# Patient Record
Sex: Male | Born: 1940
Health system: Southern US, Community
[De-identification: ages and names within clinical notes are randomized; demographics above are authoritative.]

## PROBLEM LIST (undated history)

## (undated) DIAGNOSIS — C801 Malignant (primary) neoplasm, unspecified: Secondary | ICD-10-CM

## (undated) DIAGNOSIS — I2510 Atherosclerotic heart disease of native coronary artery without angina pectoris: Secondary | ICD-10-CM

## (undated) DIAGNOSIS — G4730 Sleep apnea, unspecified: Secondary | ICD-10-CM

## (undated) DIAGNOSIS — N3289 Other specified disorders of bladder: Secondary | ICD-10-CM

## (undated) DIAGNOSIS — K4090 Unilateral inguinal hernia, without obstruction or gangrene, not specified as recurrent: Secondary | ICD-10-CM

## (undated) DIAGNOSIS — H409 Unspecified glaucoma: Secondary | ICD-10-CM

## (undated) DIAGNOSIS — J189 Pneumonia, unspecified organism: Secondary | ICD-10-CM

## (undated) DIAGNOSIS — H3530 Unspecified macular degeneration: Secondary | ICD-10-CM

## (undated) HISTORY — PX: TONSILLECTOMY: SUR1361

---

## 1993-10-05 HISTORY — PX: HEMORRHOID SURGERY: SHX153

## 1994-11-05 HISTORY — PX: OTHER SURGICAL HISTORY: SHX169

## 1998-03-22 ENCOUNTER — Ambulatory Visit (HOSPITAL_COMMUNITY): Admission: RE | Admit: 1998-03-22 | Discharge: 1998-03-22 | Payer: Self-pay | Admitting: Cardiology

## 2000-02-03 ENCOUNTER — Emergency Department (HOSPITAL_COMMUNITY): Admission: EM | Admit: 2000-02-03 | Discharge: 2000-02-03 | Payer: Self-pay | Admitting: Emergency Medicine

## 2000-02-03 ENCOUNTER — Encounter: Payer: Self-pay | Admitting: Emergency Medicine

## 2002-03-24 ENCOUNTER — Encounter: Admission: RE | Admit: 2002-03-24 | Discharge: 2002-03-24 | Payer: Self-pay | Admitting: Family Medicine

## 2002-03-24 ENCOUNTER — Encounter: Payer: Self-pay | Admitting: Family Medicine

## 2011-01-09 ENCOUNTER — Other Ambulatory Visit: Payer: Self-pay | Admitting: Internal Medicine

## 2011-01-09 DIAGNOSIS — E291 Testicular hypofunction: Secondary | ICD-10-CM

## 2011-01-14 ENCOUNTER — Other Ambulatory Visit: Payer: Self-pay

## 2011-01-15 ENCOUNTER — Ambulatory Visit
Admission: RE | Admit: 2011-01-15 | Discharge: 2011-01-15 | Disposition: A | Discharge status: Home / Self Care | Payer: Medicare Other | Source: Ambulatory Visit | Attending: Internal Medicine | Admitting: Internal Medicine

## 2011-01-15 DIAGNOSIS — E291 Testicular hypofunction: Secondary | ICD-10-CM

## 2011-10-06 HISTORY — PX: CATARACT EXTRACTION: SUR2

## 2012-01-25 ENCOUNTER — Other Ambulatory Visit: Payer: Self-pay | Admitting: Internal Medicine

## 2012-01-25 DIAGNOSIS — I719 Aortic aneurysm of unspecified site, without rupture: Secondary | ICD-10-CM

## 2012-01-29 ENCOUNTER — Ambulatory Visit
Admission: RE | Admit: 2012-01-29 | Discharge: 2012-01-29 | Disposition: A | Discharge status: Home / Self Care | Payer: BLUE CROSS/BLUE SHIELD | Source: Ambulatory Visit | Attending: Internal Medicine | Admitting: Internal Medicine

## 2012-01-29 ENCOUNTER — Ambulatory Visit
Admission: RE | Admit: 2012-01-29 | Discharge: 2012-01-29 | Disposition: A | Discharge status: Home / Self Care | Payer: Medicare Other | Source: Ambulatory Visit | Attending: Internal Medicine | Admitting: Internal Medicine

## 2012-01-29 DIAGNOSIS — I719 Aortic aneurysm of unspecified site, without rupture: Secondary | ICD-10-CM

## 2012-06-25 IMAGING — RF DG ESOPHAGUS
19 of 24 series · 19 of 24 positions shown · non-contrast
Comparison: {None.}

CLINICAL DATA: [71-year-old male with chronic intermittent
dysphagia to solids and tablets.]

ESOPHOGRAM/BARIUM SWALLOW
TECHNIQUE: Combined double contrast and single contrast
examination performed using effervescent crystals, thick barium
liquid, and thin barium liquid.
Fluoroscopy time:  [2.3] minutes.

[Series 1: run · 1 of 1 slices shown (1 of 19)]
[im 1/1]
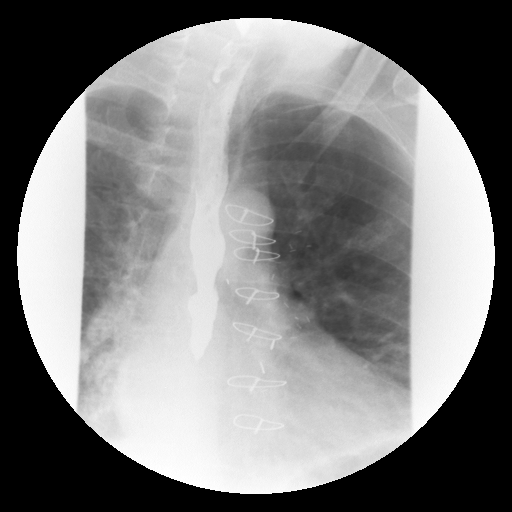

[Series 2: run · 1 of 1 slices shown (2 of 19)]
[im 1/1]
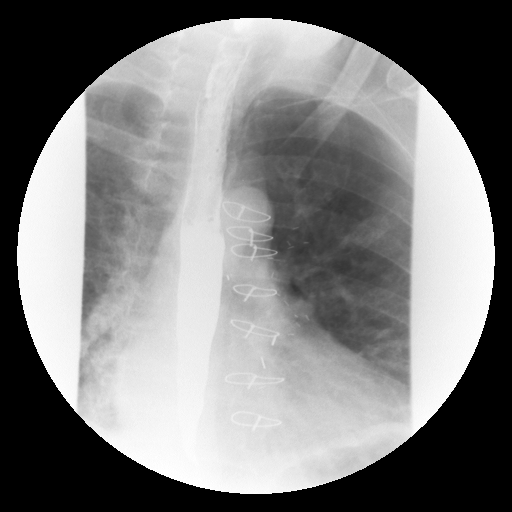

[Series 4: run · 1 of 1 slices shown (3 of 19)]
[im 1/1]
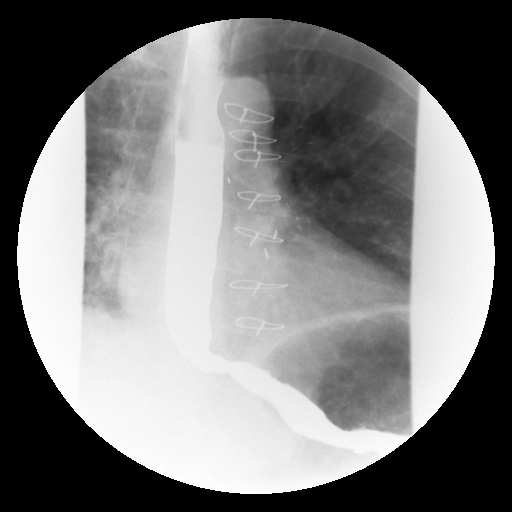

[Series 5: run · 1 of 1 slices shown (4 of 19)]
[im 1/1]
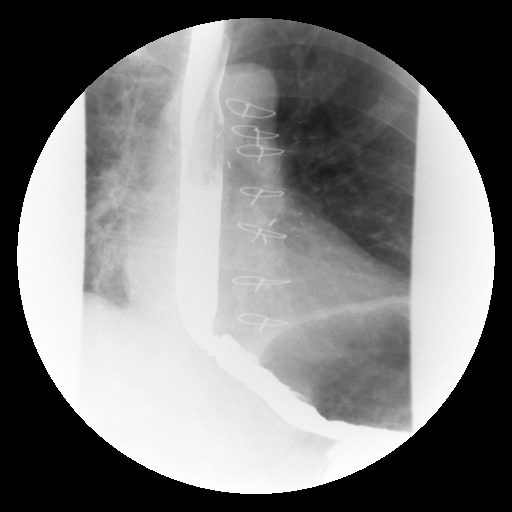

[Series 6: run · 1 of 1 slices shown (5 of 19)]
[im 1/1]
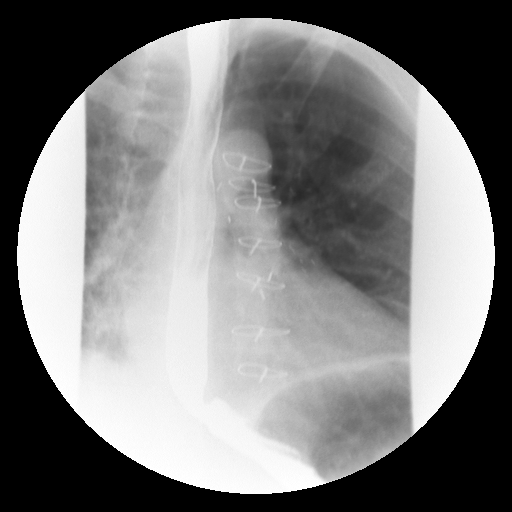

[Series 7: run · 1 of 1 slices shown (6 of 19)]
[im 1/1]
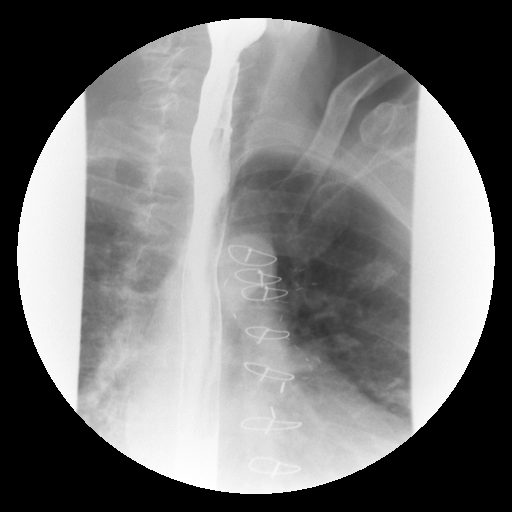

[Series 9: run · 1 of 1 slices shown (7 of 19)]
[im 1/1]
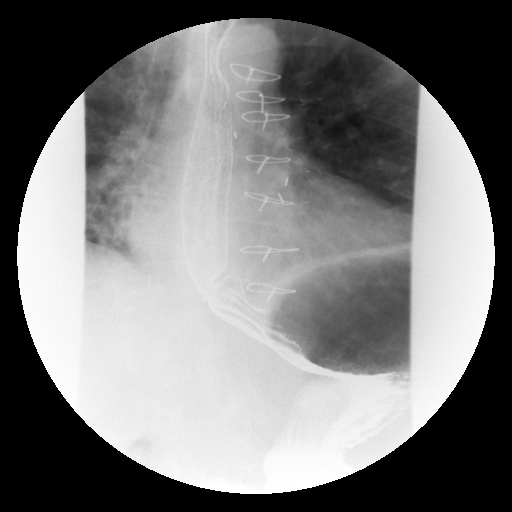

[Series 10: run · 1 of 1 slices shown (8 of 19)]
[im 1/1]
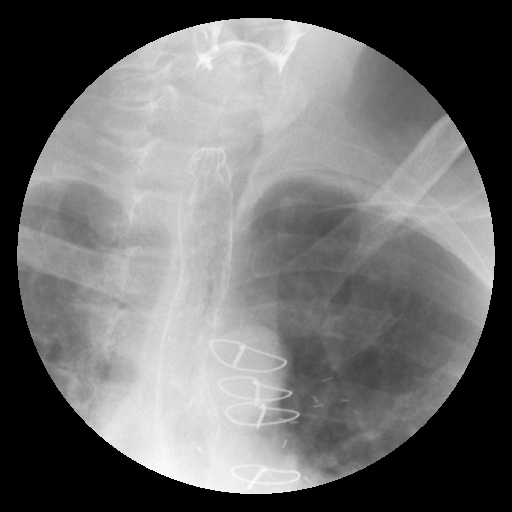

[Series 11: run · 1 of 1 slices shown (9 of 19)]
[im 1/1]
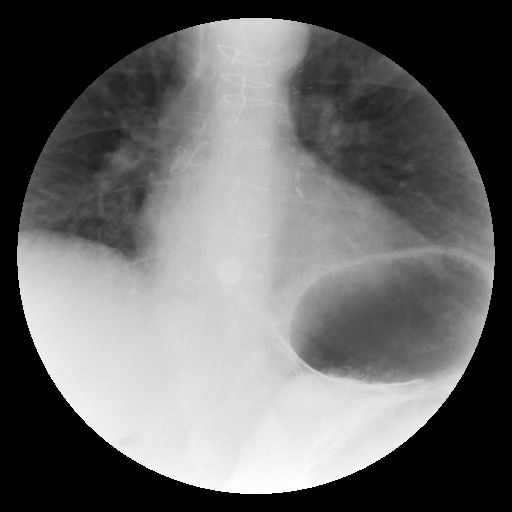

[Series 13: run · 1 of 5 slices shown (10 of 19)]
[im 1/5]
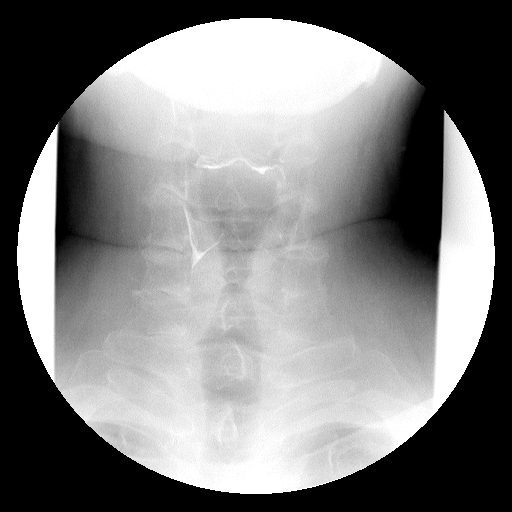

[Series 14: run · 1 of 1 slices shown (11 of 19)]
[im 1/1]
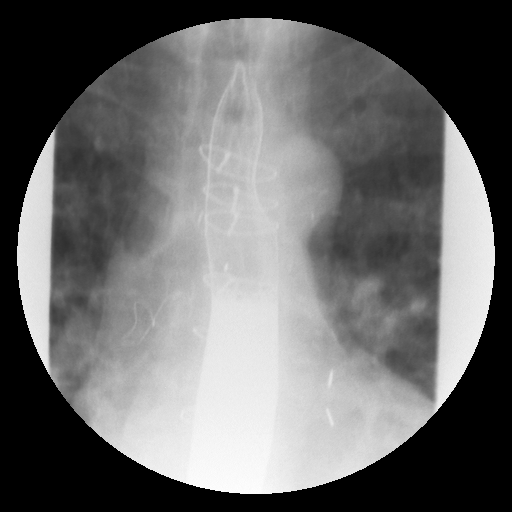

[Series 15: run · 1 of 1 slices shown (12 of 19)]
[im 1/1]
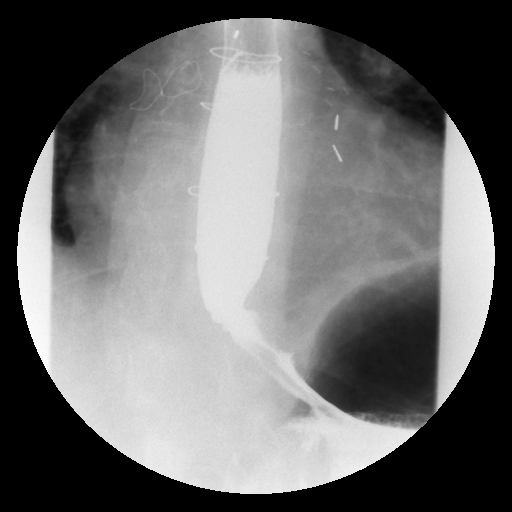

[Series 16: run · 1 of 1 slices shown (13 of 19)]
[im 1/1]
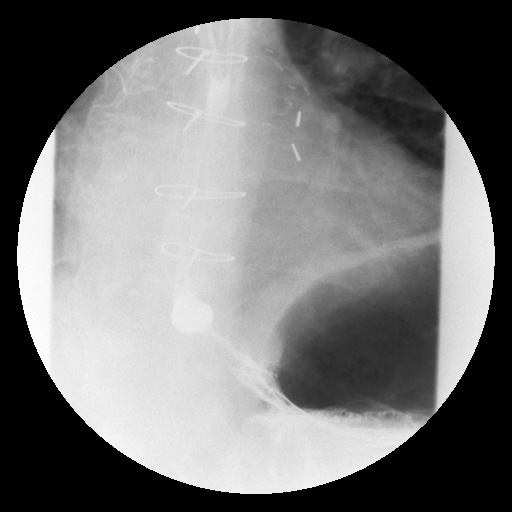

[Series 18: run · 1 of 1 slices shown (14 of 19)]
[im 1/1]
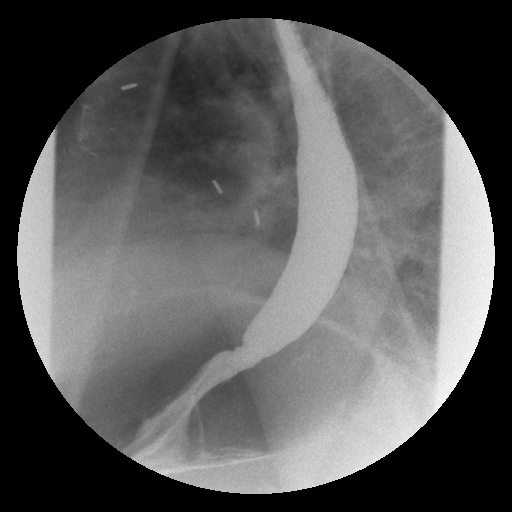

[Series 19: run · 1 of 1 slices shown (15 of 19)]
[im 1/1]
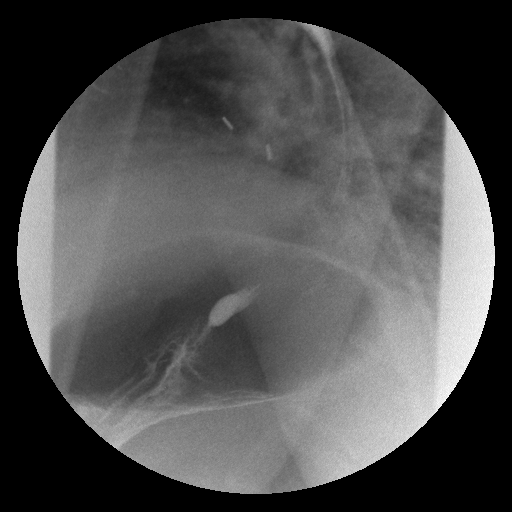

[Series 20: run · 1 of 1 slices shown (16 of 19)]
[im 1/1]
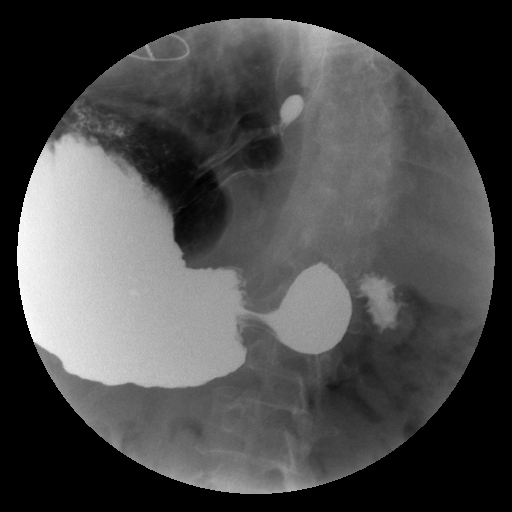

[Series 21: run · 1 of 1 slices shown (17 of 19)]
[im 1/1]
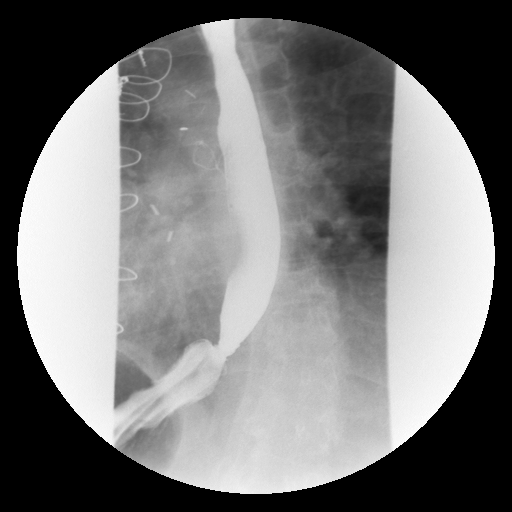

[Series 23: run · 1 of 1 slices shown (18 of 19)]
[im 1/1]
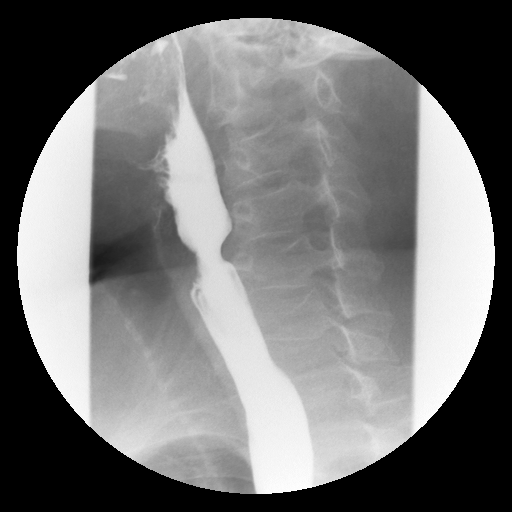

[Series 24: run · 1 of 1 slices shown (19 of 19)]
[im 1/1]
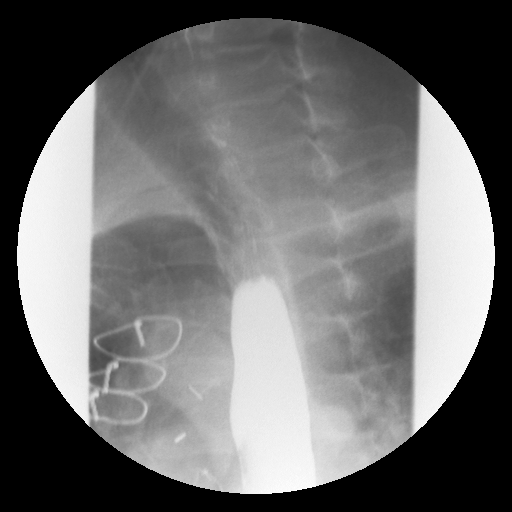

[19 of 24 positions shown; findings below may reference images not displayed]

FINDINGS: [A double contrast study was undertaken and the patient
tolerated this well and without difficulty.

No obstruction to the forward flow of barium contrast throughout
the esophagus and into the stomach.  Persistent mild focal
narrowing at the gastroesophageal junction most resembles a
Schatzki's ring.

12.5 mm barium tablet was administered and passed immediately to
the level of the ring and there was stopped.

Additional swallows of thin barium were performed for rapid
sequence evaluation of the cervical esophagus.  The cervical
esophagus is normal.  These additional swallows did not propel the
tablet into the stomach.

Prone swallows revealed a global decrease in esophageal
peristalsis.  No tertiary contractions occurred.  A small sliding-
type hiatal hernia became apparent (series [DATE]).  Still the
barium tablet remained at the level of the Schatzki's ring.

Unremarkable appearance of the visualized stomach.  No
gastroesophageal reflux was observed or elicited.]
IMPRESSION: [
1.  Findings compatible with a Schatzki's ring which did not permit
a 12.5 mm barium tablet.
2.  Mild presbyesophagus.
3.  Small sliding-type hiatal hernia.  No gastroesophageal reflux.]

## 2013-01-26 ENCOUNTER — Ambulatory Visit (HOSPITAL_COMMUNITY): Payer: Medicare Other | Attending: Internal Medicine | Admitting: Radiology

## 2013-01-26 ENCOUNTER — Encounter (HOSPITAL_COMMUNITY): Payer: Self-pay | Admitting: Internal Medicine

## 2013-01-26 VITALS — Ht 66.0 in | Wt 176.0 lb

## 2013-01-26 DIAGNOSIS — Z8249 Family history of ischemic heart disease and other diseases of the circulatory system: Secondary | ICD-10-CM | POA: Insufficient documentation

## 2013-01-26 DIAGNOSIS — R002 Palpitations: Secondary | ICD-10-CM | POA: Insufficient documentation

## 2013-01-26 DIAGNOSIS — Z951 Presence of aortocoronary bypass graft: Secondary | ICD-10-CM | POA: Insufficient documentation

## 2013-01-26 DIAGNOSIS — E785 Hyperlipidemia, unspecified: Secondary | ICD-10-CM | POA: Insufficient documentation

## 2013-01-26 DIAGNOSIS — I2510 Atherosclerotic heart disease of native coronary artery without angina pectoris: Secondary | ICD-10-CM

## 2013-01-26 DIAGNOSIS — R5383 Other fatigue: Secondary | ICD-10-CM | POA: Insufficient documentation

## 2013-01-26 DIAGNOSIS — R5381 Other malaise: Secondary | ICD-10-CM | POA: Insufficient documentation

## 2013-01-26 MED ORDER — TECHNETIUM TC 99M SESTAMIBI GENERIC - CARDIOLITE
33.0000 | Freq: Once | INTRAVENOUS | Status: AC | PRN
  Administered 2013-01-26: 33 via INTRAVENOUS

## 2013-01-26 MED ORDER — TECHNETIUM TC 99M SESTAMIBI GENERIC - CARDIOLITE
11.0000 | Freq: Once | INTRAVENOUS | Status: AC | PRN
  Administered 2013-01-26: 11 via INTRAVENOUS

## 2013-01-26 NOTE — Progress Notes (Signed)
[facility] SITE 3 NUCLEAR MED [address] [place], [place] [address] [phone]    Cardiology Nuclear Med Study  Aaron Bullock is a 72 y.o. male     MRN : [id number]     DOB: 05-Mar-1941  Procedure Date: 01/26/2013  Nuclear Med Background Indication for Stress Test:  Evaluation for Ischemia and Graft Patency History:  1996 CABG 2010 [facility]PS (outside facility) normal per patient Cardiac Risk Factors: Family History - CAD and Lipids  Symptoms:  Fatigue and Palpitations   Nuclear Pre-Procedure Caffeine/Decaff Intake:  None NPO After: 7:30am   Lungs:  clear O2 Sat: 94% on room air. IV 0.9% NS with Angio Cath:  22g  IV Site: L Antecubital  IV Started by:  Bonnita Levan, RN  Chest Size (in):  44 Cup Size: n/a  Height: 5\' 6"  (1.676 m)  Weight:  176 lb (79.833 kg)  BMI:  Body mass index is 28.42 kg/(m^2). Tech Comments:  N/A    Nuclear Med Study 1 or 2 day study: 1 day  Stress Test Type:  Stress  Reading MD: Marca Ancona, MD  Order Authorizing Provider:  Mia Creek, MD  Resting Radionuclide: Technetium 37m Sestamibi  Resting Radionuclide Dose: 11.0 mCi   Stress Radionuclide:  Technetium 33m Sestamibi  Stress Radionuclide Dose: 33.0 mCi           Stress Protocol Rest HR: 68 Stress HR: 137  Rest BP: 143/64 Stress BP: 201/80  Exercise Time (min): 5:46 METS: 7.0   Predicted Max HR: 146 bpm % Max HR: 93.84 bpm Rate Pressure Product: 01027   Dose of Adenosine (mg):  n/a Dose of Lexiscan: n/a mg  Dose of Atropine (mg): n/a Dose of Dobutamine: n/a mcg/kg/min (at max HR)  Stress Test Technologist: Bonnita Levan, RN  Nuclear Technologist:  Domenic Polite, CNMT     Rest Procedure:  Myocardial perfusion imaging was performed at rest 45 minutes following the intravenous administration of Technetium 72m Sestamibi. Rest ECG: NSR - Normal EKG  Stress Procedure:  The patient exercised on the treadmill utilizing the Bruce Protocol for 5:46 minutes. The patient stopped  due to dyspnea and fatigue and denied any chest pain.  Technetium 60m Sestamibi was injected at peak exercise and myocardial perfusion imaging was performed after a brief delay. Stress ECG: No significant change from baseline ECG  QPS Raw Data Images:  Normal; no motion artifact; normal heart/lung ratio. Stress Images:  Normal homogeneous uptake in all areas of the myocardium. Rest Images:  Normal homogeneous uptake in all areas of the myocardium. Subtraction (SDS):  There is no evidence of scar or ischemia. Transient Ischemic Dilatation (Normal <1.22):  0.96 Lung/Heart Ratio (Normal <0.45):  0.37  Quantitative Gated Spect Images QGS EDV:  82 ml QGS ESV:  29 ml  Impression Exercise Capacity:  Fair exercise capacity. BP Response:  Hypertensive blood pressure response. Clinical Symptoms:  Dyspnea and fatigue.  ECG Impression:  No significant ST segment change suggestive of ischemia. Comparison with Prior Nuclear Study: No images to compare  Overall Impression:  Normal stress nuclear study.  LV Ejection Fraction: 84%.  LV Wall Motion:  NL LV Function; NL Wall Motion  Marca Ancona 01/26/2013

## 2013-01-27 ENCOUNTER — Encounter (HOSPITAL_COMMUNITY): Payer: Self-pay | Admitting: Internal Medicine

## 2013-01-27 NOTE — Progress Notes (Signed)
Nuclear report faxed to Dr. Ananias Pilgrim. Aaron Bullock, RT-N

## 2015-01-11 ENCOUNTER — Other Ambulatory Visit: Payer: Self-pay | Admitting: Internal Medicine

## 2015-01-11 DIAGNOSIS — M810 Age-related osteoporosis without current pathological fracture: Secondary | ICD-10-CM

## 2015-01-17 ENCOUNTER — Ambulatory Visit
Admission: RE | Admit: 2015-01-17 | Discharge: 2015-01-17 | Disposition: A | Discharge status: Home / Self Care | Payer: Medicare Other | Source: Ambulatory Visit | Attending: Internal Medicine | Admitting: Internal Medicine

## 2015-01-17 DIAGNOSIS — M810 Age-related osteoporosis without current pathological fracture: Secondary | ICD-10-CM

## 2016-05-16 ENCOUNTER — Other Ambulatory Visit: Payer: Self-pay | Admitting: Ophthalmology

## 2016-05-16 DIAGNOSIS — H47011 Ischemic optic neuropathy, right eye: Secondary | ICD-10-CM

## 2016-05-28 ENCOUNTER — Ambulatory Visit
Admission: RE | Admit: 2016-05-28 | Discharge: 2016-05-28 | Disposition: A | Discharge status: Home / Self Care | Payer: Medicare Other | Source: Ambulatory Visit | Attending: Ophthalmology | Admitting: Ophthalmology

## 2016-05-28 DIAGNOSIS — H47011 Ischemic optic neuropathy, right eye: Secondary | ICD-10-CM

## 2016-05-28 MED ORDER — GADOBENATE DIMEGLUMINE 529 MG/ML IV SOLN
15.0000 mL | Freq: Once | INTRAVENOUS | Status: AC | PRN
  Administered 2016-05-28: 15 mL via INTRAVENOUS

## 2016-11-12 DIAGNOSIS — Z85828 Personal history of other malignant neoplasm of skin: Secondary | ICD-10-CM | POA: Diagnosis not present

## 2016-11-12 DIAGNOSIS — L570 Actinic keratosis: Secondary | ICD-10-CM | POA: Diagnosis not present

## 2016-11-12 DIAGNOSIS — D1800 Hemangioma unspecified site: Secondary | ICD-10-CM | POA: Diagnosis not present

## 2016-11-12 DIAGNOSIS — D225 Melanocytic nevi of trunk: Secondary | ICD-10-CM | POA: Diagnosis not present

## 2016-11-12 DIAGNOSIS — L821 Other seborrheic keratosis: Secondary | ICD-10-CM | POA: Diagnosis not present

## 2016-11-12 DIAGNOSIS — D1801 Hemangioma of skin and subcutaneous tissue: Secondary | ICD-10-CM | POA: Diagnosis not present

## 2016-11-12 DIAGNOSIS — L814 Other melanin hyperpigmentation: Secondary | ICD-10-CM | POA: Diagnosis not present

## 2016-11-12 DIAGNOSIS — Z23 Encounter for immunization: Secondary | ICD-10-CM | POA: Diagnosis not present

## 2016-12-11 DIAGNOSIS — Z961 Presence of intraocular lens: Secondary | ICD-10-CM | POA: Diagnosis not present

## 2016-12-11 DIAGNOSIS — H02839 Dermatochalasis of unspecified eye, unspecified eyelid: Secondary | ICD-10-CM | POA: Diagnosis not present

## 2016-12-11 DIAGNOSIS — H401131 Primary open-angle glaucoma, bilateral, mild stage: Secondary | ICD-10-CM | POA: Diagnosis not present

## 2016-12-11 DIAGNOSIS — H18413 Arcus senilis, bilateral: Secondary | ICD-10-CM | POA: Diagnosis not present

## 2016-12-11 DIAGNOSIS — H47011 Ischemic optic neuropathy, right eye: Secondary | ICD-10-CM | POA: Diagnosis not present

## 2017-01-12 DIAGNOSIS — H401113 Primary open-angle glaucoma, right eye, severe stage: Secondary | ICD-10-CM | POA: Diagnosis not present

## 2017-01-12 DIAGNOSIS — H35363 Drusen (degenerative) of macula, bilateral: Secondary | ICD-10-CM | POA: Diagnosis not present

## 2017-01-12 DIAGNOSIS — H3589 Other specified retinal disorders: Secondary | ICD-10-CM | POA: Diagnosis not present

## 2017-01-12 DIAGNOSIS — H47239 Glaucomatous optic atrophy, unspecified eye: Secondary | ICD-10-CM | POA: Diagnosis not present

## 2017-01-12 DIAGNOSIS — H3530 Unspecified macular degeneration: Secondary | ICD-10-CM | POA: Diagnosis not present

## 2017-01-12 DIAGNOSIS — H5340 Unspecified visual field defects: Secondary | ICD-10-CM | POA: Diagnosis not present

## 2017-02-05 DIAGNOSIS — H401113 Primary open-angle glaucoma, right eye, severe stage: Secondary | ICD-10-CM | POA: Diagnosis not present

## 2017-02-05 DIAGNOSIS — H401121 Primary open-angle glaucoma, left eye, mild stage: Secondary | ICD-10-CM | POA: Diagnosis not present

## 2017-02-05 DIAGNOSIS — Z961 Presence of intraocular lens: Secondary | ICD-10-CM | POA: Diagnosis not present

## 2017-02-05 DIAGNOSIS — H353132 Nonexudative age-related macular degeneration, bilateral, intermediate dry stage: Secondary | ICD-10-CM | POA: Diagnosis not present

## 2017-03-09 DIAGNOSIS — L820 Inflamed seborrheic keratosis: Secondary | ICD-10-CM | POA: Diagnosis not present

## 2017-03-09 DIAGNOSIS — L570 Actinic keratosis: Secondary | ICD-10-CM | POA: Diagnosis not present

## 2017-03-18 DIAGNOSIS — H4051X3 Glaucoma secondary to other eye disorders, right eye, severe stage: Secondary | ICD-10-CM | POA: Diagnosis not present

## 2017-03-18 DIAGNOSIS — H353112 Nonexudative age-related macular degeneration, right eye, intermediate dry stage: Secondary | ICD-10-CM | POA: Diagnosis not present

## 2017-03-18 DIAGNOSIS — H353211 Exudative age-related macular degeneration, right eye, with active choroidal neovascularization: Secondary | ICD-10-CM | POA: Diagnosis not present

## 2017-03-18 DIAGNOSIS — H35721 Serous detachment of retinal pigment epithelium, right eye: Secondary | ICD-10-CM | POA: Diagnosis not present

## 2017-03-18 DIAGNOSIS — H26491 Other secondary cataract, right eye: Secondary | ICD-10-CM | POA: Diagnosis not present

## 2017-04-23 DIAGNOSIS — H353211 Exudative age-related macular degeneration, right eye, with active choroidal neovascularization: Secondary | ICD-10-CM | POA: Diagnosis not present

## 2017-05-27 DIAGNOSIS — H353112 Nonexudative age-related macular degeneration, right eye, intermediate dry stage: Secondary | ICD-10-CM | POA: Diagnosis not present

## 2017-05-27 DIAGNOSIS — H353211 Exudative age-related macular degeneration, right eye, with active choroidal neovascularization: Secondary | ICD-10-CM | POA: Diagnosis not present

## 2017-05-27 DIAGNOSIS — H43821 Vitreomacular adhesion, right eye: Secondary | ICD-10-CM | POA: Diagnosis not present

## 2017-05-27 DIAGNOSIS — H35721 Serous detachment of retinal pigment epithelium, right eye: Secondary | ICD-10-CM | POA: Diagnosis not present

## 2017-06-29 DIAGNOSIS — H353112 Nonexudative age-related macular degeneration, right eye, intermediate dry stage: Secondary | ICD-10-CM | POA: Diagnosis not present

## 2017-06-29 DIAGNOSIS — H35721 Serous detachment of retinal pigment epithelium, right eye: Secondary | ICD-10-CM | POA: Diagnosis not present

## 2017-06-29 DIAGNOSIS — H353211 Exudative age-related macular degeneration, right eye, with active choroidal neovascularization: Secondary | ICD-10-CM | POA: Diagnosis not present

## 2017-06-29 DIAGNOSIS — H4051X3 Glaucoma secondary to other eye disorders, right eye, severe stage: Secondary | ICD-10-CM | POA: Diagnosis not present

## 2017-06-29 DIAGNOSIS — H43821 Vitreomacular adhesion, right eye: Secondary | ICD-10-CM | POA: Diagnosis not present

## 2017-08-05 DIAGNOSIS — H43821 Vitreomacular adhesion, right eye: Secondary | ICD-10-CM | POA: Diagnosis not present

## 2017-08-05 DIAGNOSIS — H35721 Serous detachment of retinal pigment epithelium, right eye: Secondary | ICD-10-CM | POA: Diagnosis not present

## 2017-08-05 DIAGNOSIS — H353112 Nonexudative age-related macular degeneration, right eye, intermediate dry stage: Secondary | ICD-10-CM | POA: Diagnosis not present

## 2017-08-05 DIAGNOSIS — H4051X3 Glaucoma secondary to other eye disorders, right eye, severe stage: Secondary | ICD-10-CM | POA: Diagnosis not present

## 2017-08-05 DIAGNOSIS — H353211 Exudative age-related macular degeneration, right eye, with active choroidal neovascularization: Secondary | ICD-10-CM | POA: Diagnosis not present

## 2017-09-10 DIAGNOSIS — H35721 Serous detachment of retinal pigment epithelium, right eye: Secondary | ICD-10-CM | POA: Diagnosis not present

## 2017-09-10 DIAGNOSIS — H4051X3 Glaucoma secondary to other eye disorders, right eye, severe stage: Secondary | ICD-10-CM | POA: Diagnosis not present

## 2017-09-10 DIAGNOSIS — H353211 Exudative age-related macular degeneration, right eye, with active choroidal neovascularization: Secondary | ICD-10-CM | POA: Diagnosis not present

## 2017-09-10 DIAGNOSIS — H43821 Vitreomacular adhesion, right eye: Secondary | ICD-10-CM | POA: Diagnosis not present

## 2017-10-14 DIAGNOSIS — H4051X3 Glaucoma secondary to other eye disorders, right eye, severe stage: Secondary | ICD-10-CM | POA: Diagnosis not present

## 2017-10-14 DIAGNOSIS — H353211 Exudative age-related macular degeneration, right eye, with active choroidal neovascularization: Secondary | ICD-10-CM | POA: Diagnosis not present

## 2017-10-14 DIAGNOSIS — H35721 Serous detachment of retinal pigment epithelium, right eye: Secondary | ICD-10-CM | POA: Diagnosis not present

## 2017-10-14 DIAGNOSIS — H26491 Other secondary cataract, right eye: Secondary | ICD-10-CM | POA: Diagnosis not present

## 2017-10-14 DIAGNOSIS — H43821 Vitreomacular adhesion, right eye: Secondary | ICD-10-CM | POA: Diagnosis not present

## 2017-11-18 DIAGNOSIS — H43821 Vitreomacular adhesion, right eye: Secondary | ICD-10-CM | POA: Diagnosis not present

## 2017-11-18 DIAGNOSIS — H353211 Exudative age-related macular degeneration, right eye, with active choroidal neovascularization: Secondary | ICD-10-CM | POA: Diagnosis not present

## 2017-11-18 DIAGNOSIS — H4051X3 Glaucoma secondary to other eye disorders, right eye, severe stage: Secondary | ICD-10-CM | POA: Diagnosis not present

## 2017-11-23 DIAGNOSIS — L821 Other seborrheic keratosis: Secondary | ICD-10-CM | POA: Diagnosis not present

## 2017-11-23 DIAGNOSIS — D225 Melanocytic nevi of trunk: Secondary | ICD-10-CM | POA: Diagnosis not present

## 2017-11-23 DIAGNOSIS — L814 Other melanin hyperpigmentation: Secondary | ICD-10-CM | POA: Diagnosis not present

## 2017-11-23 DIAGNOSIS — Z23 Encounter for immunization: Secondary | ICD-10-CM | POA: Diagnosis not present

## 2017-11-23 DIAGNOSIS — D1801 Hemangioma of skin and subcutaneous tissue: Secondary | ICD-10-CM | POA: Diagnosis not present

## 2017-11-23 DIAGNOSIS — L570 Actinic keratosis: Secondary | ICD-10-CM | POA: Diagnosis not present

## 2017-11-23 DIAGNOSIS — Z85828 Personal history of other malignant neoplasm of skin: Secondary | ICD-10-CM | POA: Diagnosis not present

## 2017-11-23 DIAGNOSIS — D1800 Hemangioma unspecified site: Secondary | ICD-10-CM | POA: Diagnosis not present

## 2017-12-23 DIAGNOSIS — H35721 Serous detachment of retinal pigment epithelium, right eye: Secondary | ICD-10-CM | POA: Diagnosis not present

## 2017-12-23 DIAGNOSIS — H43821 Vitreomacular adhesion, right eye: Secondary | ICD-10-CM | POA: Diagnosis not present

## 2017-12-23 DIAGNOSIS — H353211 Exudative age-related macular degeneration, right eye, with active choroidal neovascularization: Secondary | ICD-10-CM | POA: Diagnosis not present

## 2017-12-23 DIAGNOSIS — H4051X3 Glaucoma secondary to other eye disorders, right eye, severe stage: Secondary | ICD-10-CM | POA: Diagnosis not present

## 2017-12-23 DIAGNOSIS — H353112 Nonexudative age-related macular degeneration, right eye, intermediate dry stage: Secondary | ICD-10-CM | POA: Diagnosis not present

## 2018-02-02 DIAGNOSIS — H43821 Vitreomacular adhesion, right eye: Secondary | ICD-10-CM | POA: Diagnosis not present

## 2018-02-02 DIAGNOSIS — H353211 Exudative age-related macular degeneration, right eye, with active choroidal neovascularization: Secondary | ICD-10-CM | POA: Diagnosis not present

## 2018-02-02 DIAGNOSIS — H43811 Vitreous degeneration, right eye: Secondary | ICD-10-CM | POA: Diagnosis not present

## 2018-02-02 DIAGNOSIS — H4051X3 Glaucoma secondary to other eye disorders, right eye, severe stage: Secondary | ICD-10-CM | POA: Diagnosis not present

## 2018-03-09 DIAGNOSIS — H4051X3 Glaucoma secondary to other eye disorders, right eye, severe stage: Secondary | ICD-10-CM | POA: Diagnosis not present

## 2018-03-09 DIAGNOSIS — H43811 Vitreous degeneration, right eye: Secondary | ICD-10-CM | POA: Diagnosis not present

## 2018-03-09 DIAGNOSIS — H43821 Vitreomacular adhesion, right eye: Secondary | ICD-10-CM | POA: Diagnosis not present

## 2018-03-09 DIAGNOSIS — H353211 Exudative age-related macular degeneration, right eye, with active choroidal neovascularization: Secondary | ICD-10-CM | POA: Diagnosis not present

## 2018-04-12 DIAGNOSIS — H43821 Vitreomacular adhesion, right eye: Secondary | ICD-10-CM | POA: Diagnosis not present

## 2018-04-12 DIAGNOSIS — H35721 Serous detachment of retinal pigment epithelium, right eye: Secondary | ICD-10-CM | POA: Diagnosis not present

## 2018-04-12 DIAGNOSIS — H4051X3 Glaucoma secondary to other eye disorders, right eye, severe stage: Secondary | ICD-10-CM | POA: Diagnosis not present

## 2018-04-12 DIAGNOSIS — H4312 Vitreous hemorrhage, left eye: Secondary | ICD-10-CM | POA: Diagnosis not present

## 2018-04-12 DIAGNOSIS — H353211 Exudative age-related macular degeneration, right eye, with active choroidal neovascularization: Secondary | ICD-10-CM | POA: Diagnosis not present

## 2018-04-15 DIAGNOSIS — H353210 Exudative age-related macular degeneration, right eye, stage unspecified: Secondary | ICD-10-CM | POA: Diagnosis not present

## 2018-04-15 DIAGNOSIS — Z961 Presence of intraocular lens: Secondary | ICD-10-CM | POA: Diagnosis not present

## 2018-04-15 DIAGNOSIS — H353122 Nonexudative age-related macular degeneration, left eye, intermediate dry stage: Secondary | ICD-10-CM | POA: Diagnosis not present

## 2018-04-15 DIAGNOSIS — H401133 Primary open-angle glaucoma, bilateral, severe stage: Secondary | ICD-10-CM | POA: Diagnosis not present

## 2018-05-03 DIAGNOSIS — L570 Actinic keratosis: Secondary | ICD-10-CM | POA: Diagnosis not present

## 2018-05-03 DIAGNOSIS — L820 Inflamed seborrheic keratosis: Secondary | ICD-10-CM | POA: Diagnosis not present

## 2018-05-18 DIAGNOSIS — H4051X3 Glaucoma secondary to other eye disorders, right eye, severe stage: Secondary | ICD-10-CM | POA: Diagnosis not present

## 2018-05-18 DIAGNOSIS — H4312 Vitreous hemorrhage, left eye: Secondary | ICD-10-CM | POA: Diagnosis not present

## 2018-05-18 DIAGNOSIS — H35721 Serous detachment of retinal pigment epithelium, right eye: Secondary | ICD-10-CM | POA: Diagnosis not present

## 2018-05-18 DIAGNOSIS — H353211 Exudative age-related macular degeneration, right eye, with active choroidal neovascularization: Secondary | ICD-10-CM | POA: Diagnosis not present

## 2018-05-18 DIAGNOSIS — H43821 Vitreomacular adhesion, right eye: Secondary | ICD-10-CM | POA: Diagnosis not present

## 2018-07-13 DIAGNOSIS — H43821 Vitreomacular adhesion, right eye: Secondary | ICD-10-CM | POA: Diagnosis not present

## 2018-07-13 DIAGNOSIS — H4051X3 Glaucoma secondary to other eye disorders, right eye, severe stage: Secondary | ICD-10-CM | POA: Diagnosis not present

## 2018-07-13 DIAGNOSIS — H353211 Exudative age-related macular degeneration, right eye, with active choroidal neovascularization: Secondary | ICD-10-CM | POA: Diagnosis not present

## 2018-07-13 DIAGNOSIS — H43811 Vitreous degeneration, right eye: Secondary | ICD-10-CM | POA: Diagnosis not present

## 2018-08-08 DIAGNOSIS — H47011 Ischemic optic neuropathy, right eye: Secondary | ICD-10-CM | POA: Diagnosis not present

## 2018-08-08 DIAGNOSIS — H401133 Primary open-angle glaucoma, bilateral, severe stage: Secondary | ICD-10-CM | POA: Diagnosis not present

## 2018-08-08 DIAGNOSIS — H04122 Dry eye syndrome of left lacrimal gland: Secondary | ICD-10-CM | POA: Diagnosis not present

## 2018-09-07 DIAGNOSIS — H43821 Vitreomacular adhesion, right eye: Secondary | ICD-10-CM | POA: Diagnosis not present

## 2018-09-07 DIAGNOSIS — H353123 Nonexudative age-related macular degeneration, left eye, advanced atrophic without subfoveal involvement: Secondary | ICD-10-CM | POA: Diagnosis not present

## 2018-09-07 DIAGNOSIS — H4720 Unspecified optic atrophy: Secondary | ICD-10-CM | POA: Diagnosis not present

## 2018-09-07 DIAGNOSIS — H353211 Exudative age-related macular degeneration, right eye, with active choroidal neovascularization: Secondary | ICD-10-CM | POA: Diagnosis not present

## 2018-10-14 ENCOUNTER — Encounter: Payer: Self-pay | Admitting: Family Medicine

## 2018-10-14 ENCOUNTER — Ambulatory Visit (INDEPENDENT_AMBULATORY_CARE_PROVIDER_SITE_OTHER): Payer: Medicare Other | Admitting: Family Medicine

## 2018-10-14 VITALS — BP 122/76 | HR 71 | Temp 98.1°F | Wt 160.8 lb

## 2018-10-14 DIAGNOSIS — Z136 Encounter for screening for cardiovascular disorders: Secondary | ICD-10-CM | POA: Diagnosis not present

## 2018-10-14 DIAGNOSIS — Z2821 Immunization not carried out because of patient refusal: Secondary | ICD-10-CM | POA: Diagnosis not present

## 2018-10-14 DIAGNOSIS — Z0130 Encounter for examination of blood pressure without abnormal findings: Secondary | ICD-10-CM | POA: Diagnosis not present

## 2018-10-14 DIAGNOSIS — R7309 Other abnormal glucose: Secondary | ICD-10-CM | POA: Diagnosis not present

## 2018-10-14 DIAGNOSIS — Z951 Presence of aortocoronary bypass graft: Secondary | ICD-10-CM | POA: Diagnosis not present

## 2018-10-14 DIAGNOSIS — I2510 Atherosclerotic heart disease of native coronary artery without angina pectoris: Secondary | ICD-10-CM | POA: Diagnosis not present

## 2018-10-14 NOTE — Progress Notes (Addendum)
   Subjective:    Patient ID: Aaron Bullock, male    DOB: [birth year], 78 y.o.   MRN: [id number]  HPI He is here for consultation concerning his blood pressure.  He apparently had some difficulty with headache and one episode of epistaxis.  He did get a blood pressure cuff and did note a systolic in the 657 range.  He also has a previous history of CABG x324 years ago however did not start on any statin medication.  He instead adjusted his diet and went on several supplements.  In the past he had been cared for by Dr. Sharol Roussel who has now retired.  I then discussed general medical care including immunizations and he did not want any immunization of any kind. Since his CABG he has not had any chest pain, shortness of breath, PND or other cardiac symptoms.   Review of Systems     Objective:   Physical Exam Alert and in no distress.  Blood pressure is recorded and is normal.       Assessment & Plan:  Encounter for blood pressure examination - Plan: CBC with Differential/Platelet, Comprehensive metabolic panel  Immunization refused  S/P CABG x 3 - Plan: CBC with Differential/Platelet, Comprehensive metabolic panel, Lipid panel, Homocysteine  ASHD (arteriosclerotic heart disease) - Plan: CBC with Differential/Platelet, Comprehensive metabolic panel, Lipid panel, Homocysteine  Screening for cardiovascular condition I will do routine blood screening however he seems uninterested in traditional medical therapies for his underlying heart disease and also not interested in any immunizations.

## 2018-10-15 LAB — COMPREHENSIVE METABOLIC PANEL
ALT: 27 IU/L (ref 0–44)
AST: 22 IU/L (ref 0–40)
Albumin/Globulin Ratio: 1.6 (ref 1.2–2.2)
Albumin: 4 g/dL (ref 3.5–4.8)
Alkaline Phosphatase: 108 IU/L (ref 39–117)
BUN/Creatinine Ratio: 15 (ref 10–24)
BUN: 13 mg/dL (ref 8–27)
Bilirubin Total: 0.5 mg/dL (ref 0.0–1.2)
CO2: 22 mmol/L (ref 20–29)
Calcium: 9.4 mg/dL (ref 8.6–10.2)
Chloride: 103 mmol/L (ref 96–106)
Creatinine, Ser: 0.87 mg/dL (ref 0.76–1.27)
GFR calc Af Amer: 95 mL/min/{1.73_m2} (ref >59)
GFR calc non Af Amer: 82 mL/min/{1.73_m2} (ref >59)
Globulin, Total: 2.5 g/dL (ref 1.5–4.5)
Glucose: 106 mg/dL — ABNORMAL HIGH (ref 65–99)
Potassium: 4.6 mmol/L (ref 3.5–5.2)
Sodium: 143 mmol/L (ref 134–144)
Total Protein: 6.5 g/dL (ref 6.0–8.5)

## 2018-10-15 LAB — LIPID PANEL
Chol/HDL Ratio: 4 ratio (ref 0.0–5.0)
Cholesterol, Total: 202 mg/dL — ABNORMAL HIGH (ref 100–199)
HDL: 50 mg/dL (ref >39)
LDL Calculated: 133 mg/dL — ABNORMAL HIGH (ref 0–99)
Triglycerides: 93 mg/dL (ref 0–149)
VLDL Cholesterol Cal: 19 mg/dL (ref 5–40)

## 2018-10-15 LAB — CBC WITH DIFFERENTIAL/PLATELET
Basophils Absolute: 0 10*3/uL (ref 0.0–0.2)
Basos: 0 %
EOS (ABSOLUTE): 0 10*3/uL (ref 0.0–0.4)
Eos: 1 %
Hematocrit: 44 % (ref 37.5–51.0)
Hemoglobin: 14.9 g/dL (ref 13.0–17.7)
Immature Grans (Abs): 0 10*3/uL (ref 0.0–0.1)
Immature Granulocytes: 0 %
Lymphocytes Absolute: 1.9 10*3/uL (ref 0.7–3.1)
Lymphs: 22 %
MCH: 31.8 pg (ref 26.6–33.0)
MCHC: 33.9 g/dL (ref 31.5–35.7)
MCV: 94 fL (ref 79–97)
Monocytes Absolute: 0.8 10*3/uL (ref 0.1–0.9)
Monocytes: 9 %
Neutrophils Absolute: 6 10*3/uL (ref 1.4–7.0)
Neutrophils: 68 %
Platelets: 251 10*3/uL (ref 150–450)
RBC: 4.69 x10E6/uL (ref 4.14–5.80)
RDW: 12.1 % (ref 11.6–15.4)
WBC: 8.8 10*3/uL (ref 3.4–10.8)

## 2018-10-15 LAB — HOMOCYSTEINE: Homocysteine: 13.6 umol/L (ref 0.0–15.0)

## 2018-10-18 LAB — HGB A1C W/O EAG: Hgb A1c MFr Bld: 5.4 % (ref 4.8–5.6)

## 2018-11-02 DIAGNOSIS — H43821 Vitreomacular adhesion, right eye: Secondary | ICD-10-CM | POA: Diagnosis not present

## 2018-11-02 DIAGNOSIS — H35721 Serous detachment of retinal pigment epithelium, right eye: Secondary | ICD-10-CM | POA: Diagnosis not present

## 2018-11-02 DIAGNOSIS — H43812 Vitreous degeneration, left eye: Secondary | ICD-10-CM | POA: Diagnosis not present

## 2018-11-02 DIAGNOSIS — H4051X3 Glaucoma secondary to other eye disorders, right eye, severe stage: Secondary | ICD-10-CM | POA: Diagnosis not present

## 2018-11-02 DIAGNOSIS — H353123 Nonexudative age-related macular degeneration, left eye, advanced atrophic without subfoveal involvement: Secondary | ICD-10-CM | POA: Diagnosis not present

## 2018-11-02 DIAGNOSIS — H353211 Exudative age-related macular degeneration, right eye, with active choroidal neovascularization: Secondary | ICD-10-CM | POA: Diagnosis not present

## 2018-11-30 DIAGNOSIS — D485 Neoplasm of uncertain behavior of skin: Secondary | ICD-10-CM | POA: Diagnosis not present

## 2018-11-30 DIAGNOSIS — L821 Other seborrheic keratosis: Secondary | ICD-10-CM | POA: Diagnosis not present

## 2018-11-30 DIAGNOSIS — Z85828 Personal history of other malignant neoplasm of skin: Secondary | ICD-10-CM | POA: Diagnosis not present

## 2018-11-30 DIAGNOSIS — L820 Inflamed seborrheic keratosis: Secondary | ICD-10-CM | POA: Diagnosis not present

## 2018-11-30 DIAGNOSIS — Z23 Encounter for immunization: Secondary | ICD-10-CM | POA: Diagnosis not present

## 2018-11-30 DIAGNOSIS — D225 Melanocytic nevi of trunk: Secondary | ICD-10-CM | POA: Diagnosis not present

## 2018-11-30 DIAGNOSIS — L814 Other melanin hyperpigmentation: Secondary | ICD-10-CM | POA: Diagnosis not present

## 2018-12-28 DIAGNOSIS — H43811 Vitreous degeneration, right eye: Secondary | ICD-10-CM | POA: Diagnosis not present

## 2018-12-28 DIAGNOSIS — H4051X3 Glaucoma secondary to other eye disorders, right eye, severe stage: Secondary | ICD-10-CM | POA: Diagnosis not present

## 2018-12-28 DIAGNOSIS — H353211 Exudative age-related macular degeneration, right eye, with active choroidal neovascularization: Secondary | ICD-10-CM | POA: Diagnosis not present

## 2018-12-28 DIAGNOSIS — H4720 Unspecified optic atrophy: Secondary | ICD-10-CM | POA: Diagnosis not present

## 2018-12-28 DIAGNOSIS — H43821 Vitreomacular adhesion, right eye: Secondary | ICD-10-CM | POA: Diagnosis not present

## 2018-12-28 DIAGNOSIS — H353123 Nonexudative age-related macular degeneration, left eye, advanced atrophic without subfoveal involvement: Secondary | ICD-10-CM | POA: Diagnosis not present

## 2019-02-22 DIAGNOSIS — H43821 Vitreomacular adhesion, right eye: Secondary | ICD-10-CM | POA: Diagnosis not present

## 2019-02-22 DIAGNOSIS — H43812 Vitreous degeneration, left eye: Secondary | ICD-10-CM | POA: Diagnosis not present

## 2019-02-22 DIAGNOSIS — H353211 Exudative age-related macular degeneration, right eye, with active choroidal neovascularization: Secondary | ICD-10-CM | POA: Diagnosis not present

## 2019-02-22 DIAGNOSIS — H43811 Vitreous degeneration, right eye: Secondary | ICD-10-CM | POA: Diagnosis not present

## 2019-02-22 DIAGNOSIS — H4051X3 Glaucoma secondary to other eye disorders, right eye, severe stage: Secondary | ICD-10-CM | POA: Diagnosis not present

## 2019-04-27 DIAGNOSIS — H18001 Unspecified corneal deposit, right eye: Secondary | ICD-10-CM | POA: Diagnosis not present

## 2019-04-27 DIAGNOSIS — H353123 Nonexudative age-related macular degeneration, left eye, advanced atrophic without subfoveal involvement: Secondary | ICD-10-CM | POA: Diagnosis not present

## 2019-04-27 DIAGNOSIS — H353211 Exudative age-related macular degeneration, right eye, with active choroidal neovascularization: Secondary | ICD-10-CM | POA: Diagnosis not present

## 2019-04-27 DIAGNOSIS — H2011 Chronic iridocyclitis, right eye: Secondary | ICD-10-CM | POA: Diagnosis not present

## 2019-05-02 DIAGNOSIS — H401133 Primary open-angle glaucoma, bilateral, severe stage: Secondary | ICD-10-CM | POA: Diagnosis not present

## 2019-05-02 DIAGNOSIS — H30033 Focal chorioretinal inflammation, peripheral, bilateral: Secondary | ICD-10-CM | POA: Diagnosis not present

## 2019-05-02 DIAGNOSIS — H3581 Retinal edema: Secondary | ICD-10-CM | POA: Diagnosis not present

## 2019-05-02 DIAGNOSIS — H353122 Nonexudative age-related macular degeneration, left eye, intermediate dry stage: Secondary | ICD-10-CM | POA: Diagnosis not present

## 2019-05-02 DIAGNOSIS — Z961 Presence of intraocular lens: Secondary | ICD-10-CM | POA: Diagnosis not present

## 2019-05-02 DIAGNOSIS — H353211 Exudative age-related macular degeneration, right eye, with active choroidal neovascularization: Secondary | ICD-10-CM | POA: Diagnosis not present

## 2019-05-08 DIAGNOSIS — H30033 Focal chorioretinal inflammation, peripheral, bilateral: Secondary | ICD-10-CM | POA: Diagnosis not present

## 2019-05-16 DIAGNOSIS — H30033 Focal chorioretinal inflammation, peripheral, bilateral: Secondary | ICD-10-CM | POA: Diagnosis not present

## 2019-05-16 DIAGNOSIS — H3581 Retinal edema: Secondary | ICD-10-CM | POA: Diagnosis not present

## 2019-05-16 DIAGNOSIS — H353122 Nonexudative age-related macular degeneration, left eye, intermediate dry stage: Secondary | ICD-10-CM | POA: Diagnosis not present

## 2019-05-16 DIAGNOSIS — H353211 Exudative age-related macular degeneration, right eye, with active choroidal neovascularization: Secondary | ICD-10-CM | POA: Diagnosis not present

## 2019-05-16 DIAGNOSIS — Z961 Presence of intraocular lens: Secondary | ICD-10-CM | POA: Diagnosis not present

## 2019-06-06 DIAGNOSIS — H40011 Open angle with borderline findings, low risk, right eye: Secondary | ICD-10-CM | POA: Diagnosis not present

## 2019-06-06 DIAGNOSIS — H2011 Chronic iridocyclitis, right eye: Secondary | ICD-10-CM | POA: Diagnosis not present

## 2019-06-06 DIAGNOSIS — H353123 Nonexudative age-related macular degeneration, left eye, advanced atrophic without subfoveal involvement: Secondary | ICD-10-CM | POA: Diagnosis not present

## 2019-06-06 DIAGNOSIS — H353211 Exudative age-related macular degeneration, right eye, with active choroidal neovascularization: Secondary | ICD-10-CM | POA: Diagnosis not present

## 2019-06-06 DIAGNOSIS — H18001 Unspecified corneal deposit, right eye: Secondary | ICD-10-CM | POA: Diagnosis not present

## 2019-06-08 ENCOUNTER — Encounter: Payer: Self-pay | Admitting: *Deleted

## 2019-06-13 DIAGNOSIS — Z961 Presence of intraocular lens: Secondary | ICD-10-CM | POA: Diagnosis not present

## 2019-06-13 DIAGNOSIS — H353122 Nonexudative age-related macular degeneration, left eye, intermediate dry stage: Secondary | ICD-10-CM | POA: Diagnosis not present

## 2019-06-13 DIAGNOSIS — H353211 Exudative age-related macular degeneration, right eye, with active choroidal neovascularization: Secondary | ICD-10-CM | POA: Diagnosis not present

## 2019-06-13 DIAGNOSIS — H30033 Focal chorioretinal inflammation, peripheral, bilateral: Secondary | ICD-10-CM | POA: Diagnosis not present

## 2019-06-13 DIAGNOSIS — H3581 Retinal edema: Secondary | ICD-10-CM | POA: Diagnosis not present

## 2019-06-27 DIAGNOSIS — H353211 Exudative age-related macular degeneration, right eye, with active choroidal neovascularization: Secondary | ICD-10-CM | POA: Diagnosis not present

## 2019-06-27 DIAGNOSIS — H353122 Nonexudative age-related macular degeneration, left eye, intermediate dry stage: Secondary | ICD-10-CM | POA: Diagnosis not present

## 2019-06-27 DIAGNOSIS — H3581 Retinal edema: Secondary | ICD-10-CM | POA: Diagnosis not present

## 2019-06-27 DIAGNOSIS — Z961 Presence of intraocular lens: Secondary | ICD-10-CM | POA: Diagnosis not present

## 2019-06-27 DIAGNOSIS — H30033 Focal chorioretinal inflammation, peripheral, bilateral: Secondary | ICD-10-CM | POA: Diagnosis not present

## 2019-06-28 ENCOUNTER — Ambulatory Visit
Admission: RE | Admit: 2019-06-28 | Discharge: 2019-06-28 | Disposition: A | Discharge status: Home / Self Care | Payer: Medicare Other | Source: Ambulatory Visit | Attending: Family Medicine | Admitting: Family Medicine

## 2019-06-28 ENCOUNTER — Ambulatory Visit (INDEPENDENT_AMBULATORY_CARE_PROVIDER_SITE_OTHER): Payer: Medicare Other | Admitting: Family Medicine

## 2019-06-28 ENCOUNTER — Other Ambulatory Visit: Payer: Self-pay

## 2019-06-28 VITALS — BP 110/68 | HR 56 | Temp 96.4°F | Wt 160.2 lb

## 2019-06-28 DIAGNOSIS — I2510 Atherosclerotic heart disease of native coronary artery without angina pectoris: Secondary | ICD-10-CM

## 2019-06-28 DIAGNOSIS — Z0389 Encounter for observation for other suspected diseases and conditions ruled out: Secondary | ICD-10-CM | POA: Diagnosis not present

## 2019-06-28 DIAGNOSIS — Z2821 Immunization not carried out because of patient refusal: Secondary | ICD-10-CM

## 2019-06-28 DIAGNOSIS — H401113 Primary open-angle glaucoma, right eye, severe stage: Secondary | ICD-10-CM | POA: Insufficient documentation

## 2019-06-28 DIAGNOSIS — H353290 Exudative age-related macular degeneration, unspecified eye, stage unspecified: Secondary | ICD-10-CM

## 2019-06-28 DIAGNOSIS — D8683 Sarcoid iridocyclitis: Secondary | ICD-10-CM | POA: Diagnosis not present

## 2019-06-28 DIAGNOSIS — H4010X0 Unspecified open-angle glaucoma, stage unspecified: Secondary | ICD-10-CM

## 2019-06-28 NOTE — Progress Notes (Signed)
   Subjective:    Patient ID: Aaron Bullock, male    DOB: 1941-07-18, 78 y.o.   MRN: [id number]  HPI He is here for consult concerning possible sarcoidosis.  He was seen by his ophthalmologist and apparently has evidence of iridocyclitis and therefore question of sarcoid.  He has had no difficulty with chest pain, shortness of breath,cough or skin lesions he does have a history of macular degeneration as well as glaucoma. He has a remote history of cardiovascular disease with CABG over 20 years ago.  He has not followed up with cardiology and has no intention to do that. Review of Systems     Objective:   Physical Exam Alert and in no distress. Tympanic membranes and canals are normal. Pharyngeal area is normal. Neck is supple without adenopathy or thyromegaly. Cardiac exam shows a regular sinus rhythm without murmurs or gallops. Lungs are clear to auscultation. EKG shows possible old inferior.       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

## 2019-06-30 LAB — CBC WITH DIFFERENTIAL/PLATELET
Basophils Absolute: 0 10*3/uL (ref 0.0–0.2)
Basos: 1 %
EOS (ABSOLUTE): 0.1 10*3/uL (ref 0.0–0.4)
Eos: 3 %
Hematocrit: 46.7 % (ref 37.5–51.0)
Hemoglobin: 16 g/dL (ref 13.0–17.7)
Immature Grans (Abs): 0 10*3/uL (ref 0.0–0.1)
Immature Granulocytes: 0 %
Lymphocytes Absolute: 1.4 10*3/uL (ref 0.7–3.1)
Lymphs: 33 %
MCH: 32.4 pg (ref 26.6–33.0)
MCHC: 34.3 g/dL (ref 31.5–35.7)
MCV: 95 fL (ref 79–97)
Monocytes Absolute: 0.5 10*3/uL (ref 0.1–0.9)
Monocytes: 11 %
Neutrophils Absolute: 2.2 10*3/uL (ref 1.4–7.0)
Neutrophils: 52 %
Platelets: 192 10*3/uL (ref 150–450)
RBC: 4.94 x10E6/uL (ref 4.14–5.80)
RDW: 13.6 % (ref 11.6–15.4)
WBC: 4.3 10*3/uL (ref 3.4–10.8)

## 2019-06-30 LAB — COMPREHENSIVE METABOLIC PANEL
ALT: 16 IU/L (ref 0–44)
AST: 19 IU/L (ref 0–40)
Albumin/Globulin Ratio: 2.2 (ref 1.2–2.2)
Albumin: 4.2 g/dL (ref 3.7–4.7)
Alkaline Phosphatase: 52 IU/L (ref 39–117)
BUN/Creatinine Ratio: 19 (ref 10–24)
BUN: 18 mg/dL (ref 8–27)
Bilirubin Total: 0.4 mg/dL (ref 0.0–1.2)
CO2: 25 mmol/L (ref 20–29)
Calcium: 9.8 mg/dL (ref 8.6–10.2)
Chloride: 104 mmol/L (ref 96–106)
Creatinine, Ser: 0.93 mg/dL (ref 0.76–1.27)
GFR calc Af Amer: 89 mL/min/{1.73_m2} (ref >59)
GFR calc non Af Amer: 77 mL/min/{1.73_m2} (ref >59)
Globulin, Total: 1.9 g/dL (ref 1.5–4.5)
Glucose: 102 mg/dL — ABNORMAL HIGH (ref 65–99)
Potassium: 4.4 mmol/L (ref 3.5–5.2)
Sodium: 140 mmol/L (ref 134–144)
Total Protein: 6.1 g/dL (ref 6.0–8.5)

## 2019-06-30 LAB — QUANTIFERON-TB GOLD PLUS
QuantiFERON Mitogen Value: >10 IU/mL
QuantiFERON Nil Value: 0.02 IU/mL
QuantiFERON TB1 Ag Value: 0.02 IU/mL
QuantiFERON TB2 Ag Value: 0.05 IU/mL
QuantiFERON-TB Gold Plus: NEGATIVE

## 2019-06-30 LAB — SEDIMENTATION RATE: Sed Rate: 19 mm/hr (ref 0–30)

## 2019-07-11 DIAGNOSIS — H353123 Nonexudative age-related macular degeneration, left eye, advanced atrophic without subfoveal involvement: Secondary | ICD-10-CM | POA: Diagnosis not present

## 2019-07-11 DIAGNOSIS — H4720 Unspecified optic atrophy: Secondary | ICD-10-CM | POA: Diagnosis not present

## 2019-07-11 DIAGNOSIS — H353211 Exudative age-related macular degeneration, right eye, with active choroidal neovascularization: Secondary | ICD-10-CM | POA: Diagnosis not present

## 2019-07-11 DIAGNOSIS — H35722 Serous detachment of retinal pigment epithelium, left eye: Secondary | ICD-10-CM | POA: Diagnosis not present

## 2019-07-11 DIAGNOSIS — H353112 Nonexudative age-related macular degeneration, right eye, intermediate dry stage: Secondary | ICD-10-CM | POA: Diagnosis not present

## 2019-07-11 DIAGNOSIS — H35721 Serous detachment of retinal pigment epithelium, right eye: Secondary | ICD-10-CM | POA: Diagnosis not present

## 2019-07-13 ENCOUNTER — Telehealth: Payer: Self-pay | Admitting: Family Medicine

## 2019-07-13 NOTE — Telephone Encounter (Signed)
Pt called and req copy of labs and xrays

## 2019-07-25 DIAGNOSIS — H35721 Serous detachment of retinal pigment epithelium, right eye: Secondary | ICD-10-CM | POA: Diagnosis not present

## 2019-07-25 DIAGNOSIS — H35722 Serous detachment of retinal pigment epithelium, left eye: Secondary | ICD-10-CM | POA: Diagnosis not present

## 2019-07-25 DIAGNOSIS — H18001 Unspecified corneal deposit, right eye: Secondary | ICD-10-CM | POA: Diagnosis not present

## 2019-07-25 DIAGNOSIS — H353211 Exudative age-related macular degeneration, right eye, with active choroidal neovascularization: Secondary | ICD-10-CM | POA: Diagnosis not present

## 2019-08-21 ENCOUNTER — Telehealth: Payer: Self-pay

## 2019-08-21 NOTE — Telephone Encounter (Signed)
Pt is scheduled on November 24

## 2019-08-21 NOTE — Telephone Encounter (Signed)
Pt. Called stating that he would like to have his Vitamin D level checked and would like to schedule a lab visit, I told him I would check with you first and call him back to schedule you for a lab visit.

## 2019-08-21 NOTE — Telephone Encounter (Signed)
Schedule him for an office visit and blood draw

## 2019-08-22 DIAGNOSIS — Z961 Presence of intraocular lens: Secondary | ICD-10-CM | POA: Diagnosis not present

## 2019-08-22 DIAGNOSIS — Z79899 Other long term (current) drug therapy: Secondary | ICD-10-CM | POA: Diagnosis not present

## 2019-08-22 DIAGNOSIS — H30033 Focal chorioretinal inflammation, peripheral, bilateral: Secondary | ICD-10-CM | POA: Diagnosis not present

## 2019-08-22 DIAGNOSIS — H353122 Nonexudative age-related macular degeneration, left eye, intermediate dry stage: Secondary | ICD-10-CM | POA: Diagnosis not present

## 2019-08-22 DIAGNOSIS — H3581 Retinal edema: Secondary | ICD-10-CM | POA: Diagnosis not present

## 2019-08-22 DIAGNOSIS — H353211 Exudative age-related macular degeneration, right eye, with active choroidal neovascularization: Secondary | ICD-10-CM | POA: Diagnosis not present

## 2019-08-29 ENCOUNTER — Other Ambulatory Visit: Payer: Self-pay

## 2019-08-29 ENCOUNTER — Ambulatory Visit (INDEPENDENT_AMBULATORY_CARE_PROVIDER_SITE_OTHER): Payer: Medicare Other | Admitting: Family Medicine

## 2019-08-29 VITALS — BP 122/70 | HR 65 | Temp 97.5°F | Wt 163.2 lb

## 2019-08-29 DIAGNOSIS — I2510 Atherosclerotic heart disease of native coronary artery without angina pectoris: Secondary | ICD-10-CM

## 2019-08-29 DIAGNOSIS — E559 Vitamin D deficiency, unspecified: Secondary | ICD-10-CM

## 2019-08-29 NOTE — Progress Notes (Signed)
   Subjective:    Patient ID: Aaron Bullock, male    DOB: Nov 19, 1940, 78 y.o.   MRN: [id number]  HPI He recently read that good vitamin D levels can have a positive effect on Covid and he would like his vitamin D level checked.  He usually takes very good care of himself but does not like immunizations and is definitely involved in alternative medicines.   Review of Systems     Objective:   Physical Exam Alert and in no distress otherwise not examined      Assessment & Plan:  Vitamin D insufficiency - Plan: Vitamin D 25 hydroxy

## 2019-08-30 LAB — VITAMIN D 25 HYDROXY (VIT D DEFICIENCY, FRACTURES): Vit D, 25-Hydroxy: 63.3 ng/mL (ref 30.0–100.0)

## 2019-09-06 ENCOUNTER — Telehealth: Payer: Self-pay | Admitting: Family Medicine

## 2019-09-06 DIAGNOSIS — H35722 Serous detachment of retinal pigment epithelium, left eye: Secondary | ICD-10-CM | POA: Diagnosis not present

## 2019-09-06 DIAGNOSIS — H353211 Exudative age-related macular degeneration, right eye, with active choroidal neovascularization: Secondary | ICD-10-CM | POA: Diagnosis not present

## 2019-09-06 DIAGNOSIS — D8683 Sarcoid iridocyclitis: Secondary | ICD-10-CM | POA: Diagnosis not present

## 2019-09-06 DIAGNOSIS — G4719 Other hypersomnia: Secondary | ICD-10-CM

## 2019-09-06 DIAGNOSIS — H4720 Unspecified optic atrophy: Secondary | ICD-10-CM | POA: Diagnosis not present

## 2019-09-06 DIAGNOSIS — H353290 Exudative age-related macular degeneration, unspecified eye, stage unspecified: Secondary | ICD-10-CM

## 2019-09-06 DIAGNOSIS — H353123 Nonexudative age-related macular degeneration, left eye, advanced atrophic without subfoveal involvement: Secondary | ICD-10-CM | POA: Diagnosis not present

## 2019-09-06 DIAGNOSIS — H35721 Serous detachment of retinal pigment epithelium, right eye: Secondary | ICD-10-CM | POA: Diagnosis not present

## 2019-09-06 NOTE — Telephone Encounter (Signed)
I received a call from Dr. [name] concerning the diagnosis of macular changes probably secondary to sleep apnea.  I discussed this with Aaron Bullock and also did an Epworth Sleepiness Scale.  He scored 13 on the scale.  I will set him up for a home study.

## 2019-09-06 NOTE — Telephone Encounter (Signed)
Received a call from Flora at [facility] and [facility]. She is requesting that pt be set of for a sleep study due to sleep apnea. Pt can be reached at [phone].

## 2019-11-20 ENCOUNTER — Other Ambulatory Visit: Payer: Self-pay

## 2019-11-20 ENCOUNTER — Ambulatory Visit (HOSPITAL_BASED_OUTPATIENT_CLINIC_OR_DEPARTMENT_OTHER): Payer: Medicare Other | Attending: Family Medicine | Admitting: Internal Medicine

## 2019-11-20 DIAGNOSIS — G4733 Obstructive sleep apnea (adult) (pediatric): Secondary | ICD-10-CM

## 2019-11-20 DIAGNOSIS — H353290 Exudative age-related macular degeneration, unspecified eye, stage unspecified: Secondary | ICD-10-CM

## 2019-11-20 DIAGNOSIS — G4719 Other hypersomnia: Secondary | ICD-10-CM

## 2019-11-23 IMAGING — DX DG CHEST 2V
2 series · 2 of 2 positions shown · non-contrast
Comparison: None.

CLINICAL DATA: Possible ocular sarcoid

EXAM:
CHEST - 2 VIEW

[dg chest 2 view (1 of 2)]
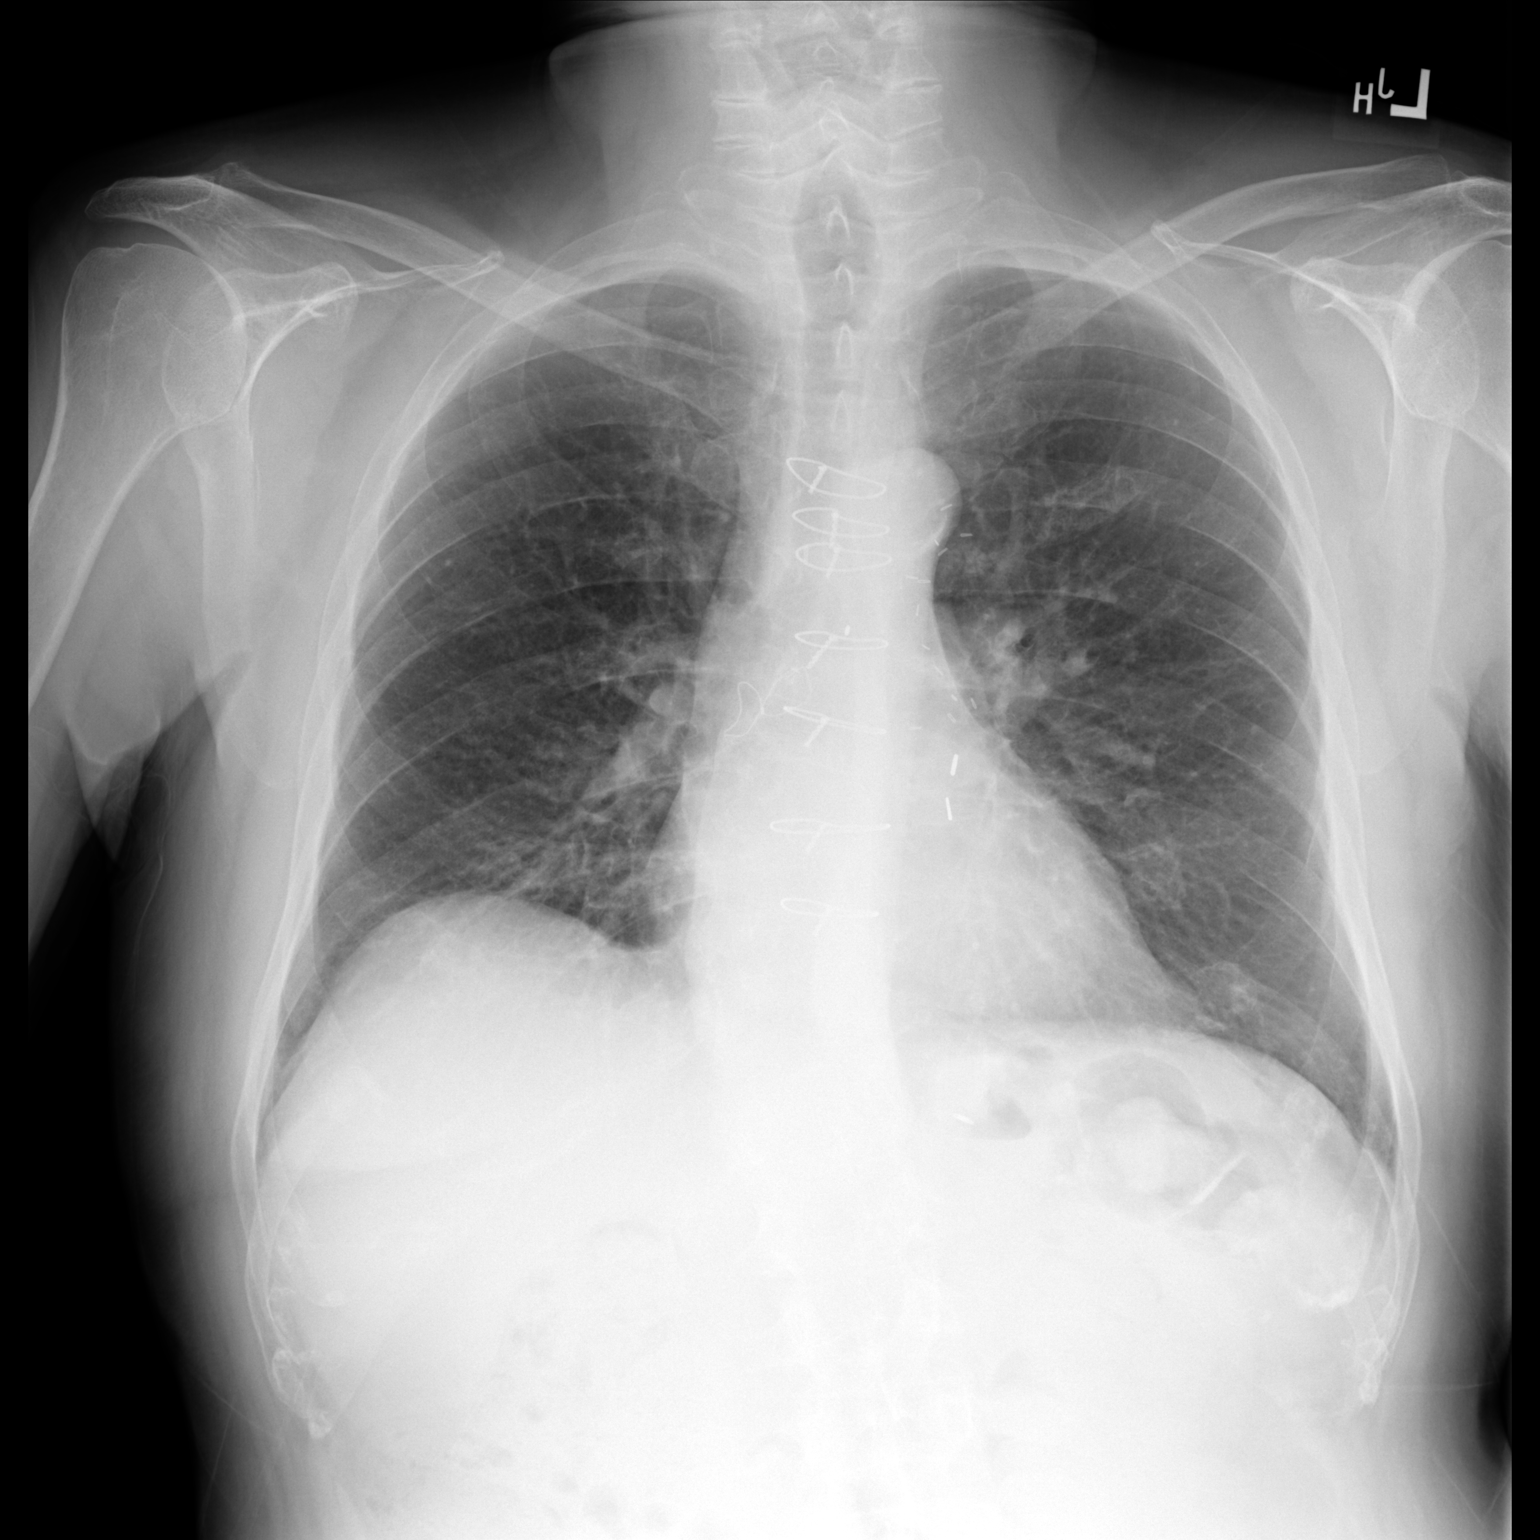

[dg chest 2 view (2 of 2)]
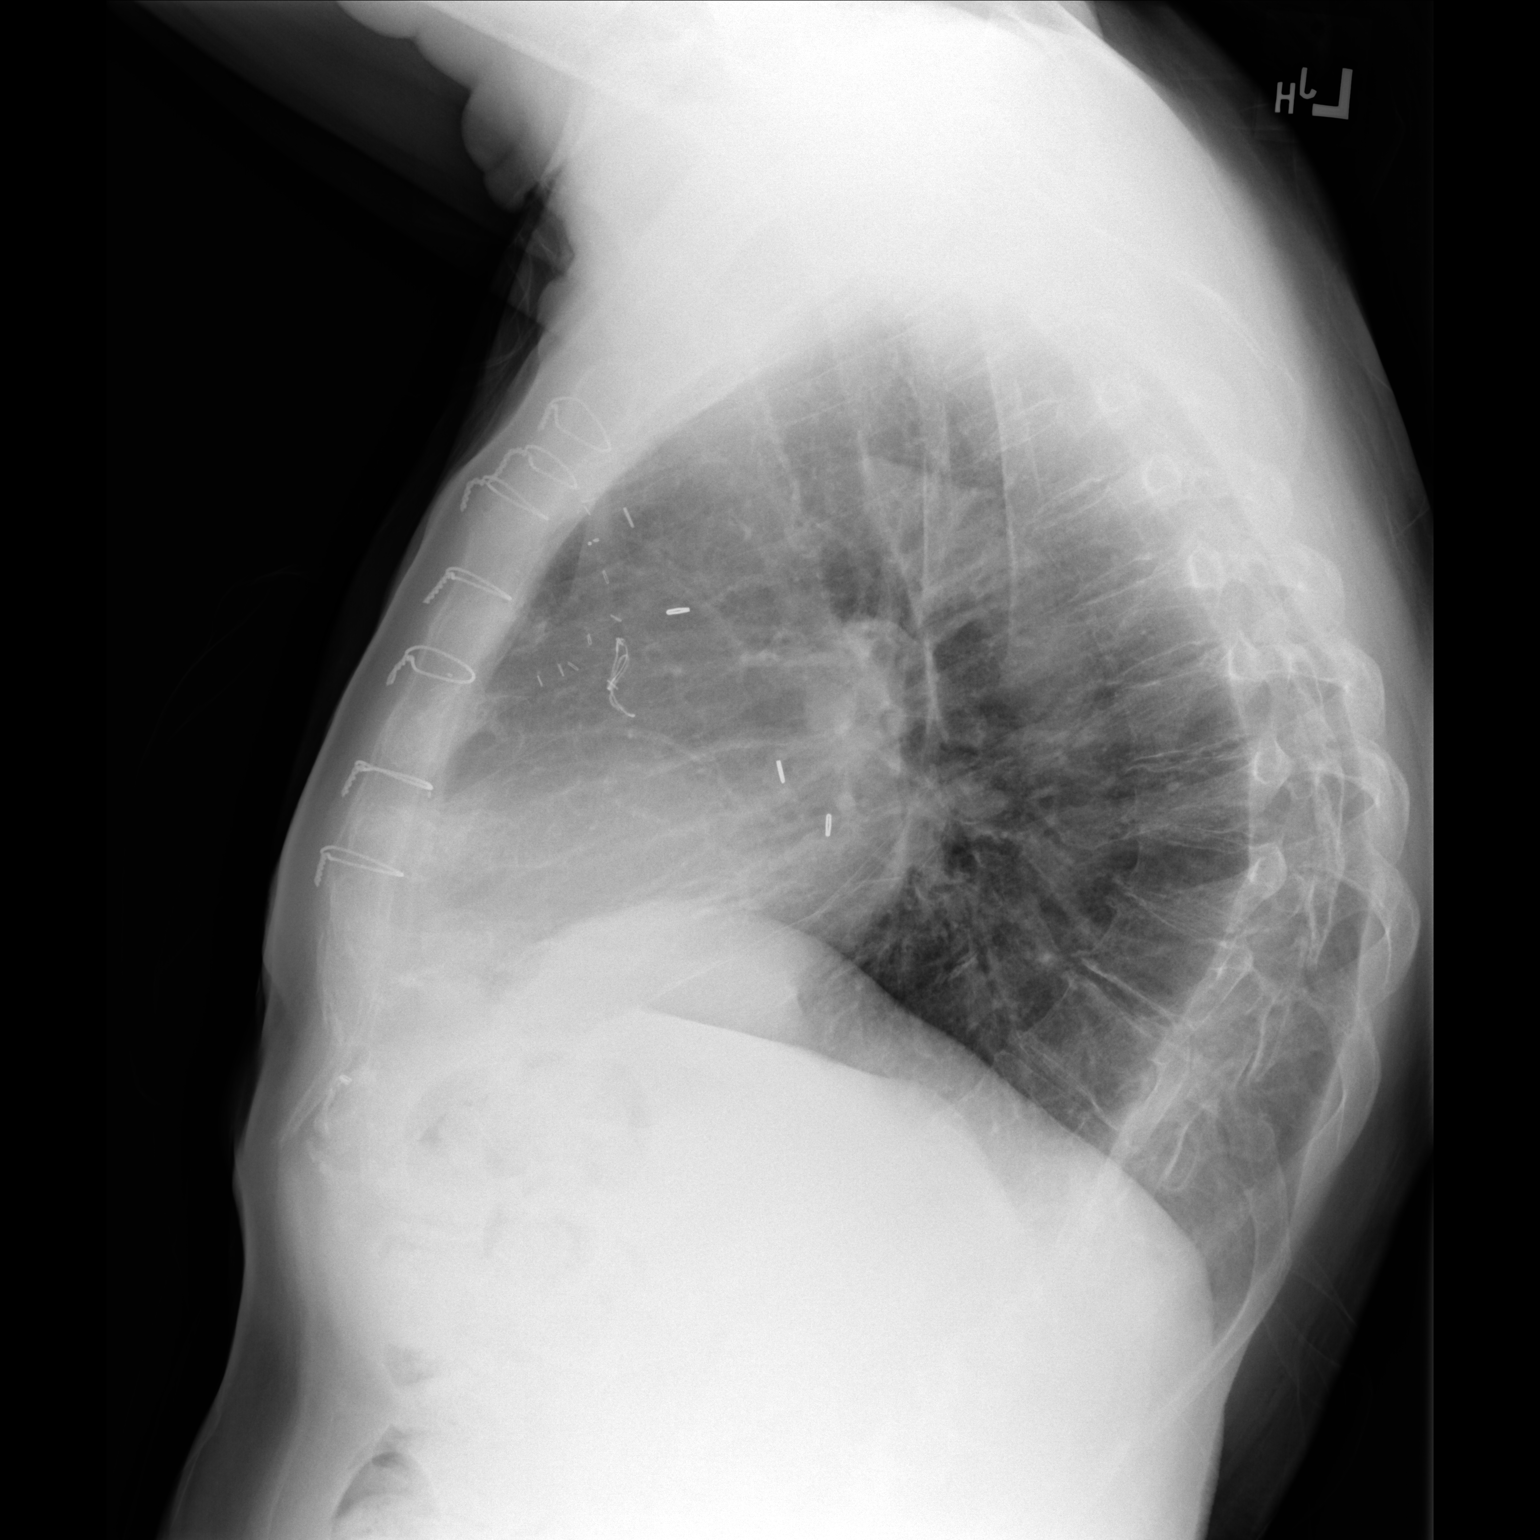

[2 of 2 positions shown; findings below may reference images not displayed]

FINDINGS: Signs of median sternotomy for coronary revascularization. Heart
size is normal. No signs of interstitial thickening by plain
radiograph. No signs of adenopathy.

Osteopenia without acute bone finding.
IMPRESSION: Postoperative changes of coronary revascularization/CABG without
acute cardiopulmonary finding.

No signs of adenopathy or interstitial disease.

## 2019-11-24 DIAGNOSIS — Z79899 Other long term (current) drug therapy: Secondary | ICD-10-CM | POA: Diagnosis not present

## 2019-11-24 DIAGNOSIS — H353122 Nonexudative age-related macular degeneration, left eye, intermediate dry stage: Secondary | ICD-10-CM | POA: Diagnosis not present

## 2019-11-24 DIAGNOSIS — H3581 Retinal edema: Secondary | ICD-10-CM | POA: Diagnosis not present

## 2019-11-24 DIAGNOSIS — Z961 Presence of intraocular lens: Secondary | ICD-10-CM | POA: Diagnosis not present

## 2019-11-24 DIAGNOSIS — H353211 Exudative age-related macular degeneration, right eye, with active choroidal neovascularization: Secondary | ICD-10-CM | POA: Diagnosis not present

## 2019-11-24 DIAGNOSIS — H30033 Focal chorioretinal inflammation, peripheral, bilateral: Secondary | ICD-10-CM | POA: Diagnosis not present

## 2019-11-29 ENCOUNTER — Telehealth: Payer: Self-pay

## 2019-11-29 NOTE — Telephone Encounter (Signed)
Pt called wanting to know about his sleep study he had . Please advise . [place]

## 2019-11-30 NOTE — Telephone Encounter (Signed)
The results are not in record yet.

## 2019-11-30 NOTE — Telephone Encounter (Signed)
Called pt back to advise we have not gotten his results back as of yet. [place]

## 2019-12-02 DIAGNOSIS — G4719 Other hypersomnia: Secondary | ICD-10-CM

## 2019-12-02 DIAGNOSIS — G4733 Obstructive sleep apnea (adult) (pediatric): Secondary | ICD-10-CM | POA: Diagnosis not present

## 2019-12-02 DIAGNOSIS — H353290 Exudative age-related macular degeneration, unspecified eye, stage unspecified: Secondary | ICD-10-CM | POA: Diagnosis not present

## 2019-12-02 NOTE — Procedures (Signed)
    Patient Name: Aaron Bullock, Aaron Bullock Date: 11/20/2019 Gender: Male D.O.B: [birth year] Age (years): 55 Referring Provider: Denita Lung Height (inches): 17 Interpreting Physician: Baird Lyons MD, ABSM Weight (lbs): 155 RPSGT: [place], Holly BMI: 25 MRN: [id number] Neck Size: 15.50  CLINICAL INFORMATION Sleep Study Type: HST Indication for sleep study: OSA Epworth Sleepiness Score: 8  SLEEP STUDY TECHNIQUE A multi-channel overnight portable sleep study was performed. The channels recorded were: nasal airflow, thoracic respiratory movement, and oxygen saturation with a pulse oximetry. Snoring was also monitored.  MEDICATIONS Patient self administered medications include: None reported.  SLEEP ARCHITECTURE Patient was studied for 364.4 minutes. The sleep efficiency was 100.0 % and the patient was supine for 0%. The arousal index was 0.0 per hour.  RESPIRATORY PARAMETERS The overall AHI was 17.1 per hour, with a central apnea index of 0.2 per hour. The oxygen nadir was 84% during sleep.  CARDIAC DATA Mean heart rate during sleep was 67.9 bpm.  IMPRESSIONS - Moderate obstructive sleep apnea occurred during this study (AHI = 17.1/h). - No significant central sleep apnea occurred during this study (CAI = 0.2/h). - Moderate oxygen desaturation was noted during this study (Min O2 = 84%). Mean sat 94%. - Time with O2 saturation 89% or less was 1.8 minutes. - Patient snored.  DIAGNOSIS - Obstructive Sleep Apnea (327.23 [G47.33 ICD-10])  RECOMMENDATIONS - Suggest CPAP titration sleep study or autopap. Other options, including a fitted oral appliance or ENT evaluation, would be based on clinical judgment. - Be careful with alcohol, sedatives and other CNS depressants that may worsen sleep apnea and disrupt normal sleep architecture. - Sleep hygiene should be reviewed to assess factors that may improve sleep quality. - Weight management and regular exercise should be  initiated or continued.  [Electronically signed] 12/02/2019 10:52 AM  Baird Lyons MD, ABSM Diplomate, American Board of Sleep Medicine   NPI: [id number]                         [place]  ELECTRONICALLY SIGNED ON:  12/02/2019, 10:49 AM [place] PH: [phone]   FX: [phone] [place]

## 2019-12-05 ENCOUNTER — Telehealth: Payer: Self-pay | Admitting: Family Medicine

## 2019-12-05 NOTE — Telephone Encounter (Signed)
error 

## 2019-12-07 ENCOUNTER — Other Ambulatory Visit: Payer: Self-pay

## 2019-12-07 ENCOUNTER — Ambulatory Visit (INDEPENDENT_AMBULATORY_CARE_PROVIDER_SITE_OTHER): Payer: Medicare Other | Admitting: Family Medicine

## 2019-12-07 ENCOUNTER — Encounter: Payer: Self-pay | Admitting: Family Medicine

## 2019-12-07 VITALS — Temp 97.0°F | Wt 155.0 lb

## 2019-12-07 DIAGNOSIS — G4733 Obstructive sleep apnea (adult) (pediatric): Secondary | ICD-10-CM | POA: Diagnosis not present

## 2019-12-07 NOTE — Progress Notes (Signed)
   Subjective:    Patient ID: Aaron Bullock, male    DOB: 04-03-1941, 79 y.o.   MRN: [id number]  HPI Documentation for virtual telephone encounter.  Interactive audio and video telecommunications were attempted between this provider and patient, however he  did not have access to video capability.  We continued and completed visit with audio only. The patient was located at home. The provider was located in the office. The patient did consent to this visit and is aware of possible charges through their insurance for this visit. The other persons participating in this telemedicine service were none. Time spent on call was 3 minutes and in review of previous records >13 minutes total. This virtual service is not related to other E/M service within previous 7 days. He recently had a sleep study done which did show sleep apnea with an AHI of 17.1.  He also had less than 2 minutes of desaturation.  He does have a dental appliance that he had made however he did not use it for the test.  He has been having difficulty with his vision which prompted the sleep study.  He is taking cyclosporine 25 mg as well as azathioprine from ophthalmology.    Review of Systems     Objective:   Physical Exam  Alert and in no distress otherwise not examined      Assessment & Plan:  OSA (obstructive sleep apnea) I discussed the results of the sleep study with him including the oxygen levels.  Since he does have an appliance already made, I will reorder the sleep study with the use of the appliance to see how much benefit he is getting from that.  He was comfortable with that.

## 2019-12-18 DIAGNOSIS — H35721 Serous detachment of retinal pigment epithelium, right eye: Secondary | ICD-10-CM | POA: Diagnosis not present

## 2019-12-18 DIAGNOSIS — H35722 Serous detachment of retinal pigment epithelium, left eye: Secondary | ICD-10-CM | POA: Diagnosis not present

## 2019-12-18 DIAGNOSIS — H2011 Chronic iridocyclitis, right eye: Secondary | ICD-10-CM | POA: Diagnosis not present

## 2019-12-18 DIAGNOSIS — H353211 Exudative age-related macular degeneration, right eye, with active choroidal neovascularization: Secondary | ICD-10-CM | POA: Diagnosis not present

## 2019-12-19 DIAGNOSIS — H401133 Primary open-angle glaucoma, bilateral, severe stage: Secondary | ICD-10-CM | POA: Diagnosis not present

## 2019-12-19 DIAGNOSIS — H353212 Exudative age-related macular degeneration, right eye, with inactive choroidal neovascularization: Secondary | ICD-10-CM | POA: Diagnosis not present

## 2019-12-19 DIAGNOSIS — Z961 Presence of intraocular lens: Secondary | ICD-10-CM | POA: Diagnosis not present

## 2019-12-19 DIAGNOSIS — H04122 Dry eye syndrome of left lacrimal gland: Secondary | ICD-10-CM | POA: Diagnosis not present

## 2020-01-15 DIAGNOSIS — L578 Other skin changes due to chronic exposure to nonionizing radiation: Secondary | ICD-10-CM | POA: Diagnosis not present

## 2020-01-15 DIAGNOSIS — Z85828 Personal history of other malignant neoplasm of skin: Secondary | ICD-10-CM | POA: Diagnosis not present

## 2020-01-15 DIAGNOSIS — D485 Neoplasm of uncertain behavior of skin: Secondary | ICD-10-CM | POA: Diagnosis not present

## 2020-01-15 DIAGNOSIS — D225 Melanocytic nevi of trunk: Secondary | ICD-10-CM | POA: Diagnosis not present

## 2020-01-15 DIAGNOSIS — L570 Actinic keratosis: Secondary | ICD-10-CM | POA: Diagnosis not present

## 2020-01-15 DIAGNOSIS — L820 Inflamed seborrheic keratosis: Secondary | ICD-10-CM | POA: Diagnosis not present

## 2020-01-15 DIAGNOSIS — L821 Other seborrheic keratosis: Secondary | ICD-10-CM | POA: Diagnosis not present

## 2020-01-15 DIAGNOSIS — L814 Other melanin hyperpigmentation: Secondary | ICD-10-CM | POA: Diagnosis not present

## 2020-01-15 DIAGNOSIS — D0439 Carcinoma in situ of skin of other parts of face: Secondary | ICD-10-CM | POA: Diagnosis not present

## 2020-01-18 ENCOUNTER — Encounter (INDEPENDENT_AMBULATORY_CARE_PROVIDER_SITE_OTHER): Payer: Medicare Other | Admitting: Ophthalmology

## 2020-01-24 ENCOUNTER — Ambulatory Visit (INDEPENDENT_AMBULATORY_CARE_PROVIDER_SITE_OTHER): Payer: Medicare Other | Admitting: Ophthalmology

## 2020-01-24 ENCOUNTER — Other Ambulatory Visit: Payer: Self-pay

## 2020-01-24 ENCOUNTER — Encounter (INDEPENDENT_AMBULATORY_CARE_PROVIDER_SITE_OTHER): Payer: Self-pay | Admitting: Ophthalmology

## 2020-01-24 DIAGNOSIS — H35722 Serous detachment of retinal pigment epithelium, left eye: Secondary | ICD-10-CM

## 2020-01-24 DIAGNOSIS — H353211 Exudative age-related macular degeneration, right eye, with active choroidal neovascularization: Secondary | ICD-10-CM | POA: Diagnosis not present

## 2020-01-24 DIAGNOSIS — H35721 Serous detachment of retinal pigment epithelium, right eye: Secondary | ICD-10-CM

## 2020-01-24 NOTE — Patient Instructions (Signed)
The nature of dry age related macular degeneration was discussed with the patient as well as its possible conversion to wet. The results of the AREDS 2 study was discussed with the patient. A diet rich in dark leafy green vegetables was advised and specific recommendations were made regarding supplements with AREDS 2 formulation . Control of hypertension and serum cholesterol may slow the disease. Smoking cessation is mandatory to slow the disease and diminish the risk of progressing to wet age related macular degeneration. The patient was instructed in the use of an Tira and was told to return immediately for any changes in the Grid. Stressed to the patient do not rub eyes  OU, may be improved using oral EMA device nightly.  Patient is considering use of nightly nasal cannula oxygen via an oxygenator as well.  He currently chooses not to commence with formal CPAP treatment.  I do encourage all measures to maximize oxygenation particularly during the night.

## 2020-01-24 NOTE — Progress Notes (Signed)
01/24/2020     CHIEF COMPLAINT Patient presents for Retina Follow Up   HISTORY OF PRESENT ILLNESS: Aaron Bullock is a 79 y.o. male who presents to the clinic today for:   HPI    Retina Follow Up    Patient presents with  Dry AMD.  In right eye.  Severity is moderate.  Since onset it is stable.  I, the attending physician,  performed the HPI with the patient and updated documentation appropriately.          Comments    5 Week AMD f\u OU. OCT  Pt states vision is dim and he needs more light to see things clearly.        Last edited by [name] on 01/24/2020  8:11 AM. (History)      Referring physician: Denita Lung, [place],  [place] [address]  HISTORICAL INFORMATION:   Selected notes from the MEDICAL RECORD NUMBER    Lab Results  Component Value Date   HGBA1C 5.4 10/14/2018     CURRENT MEDICATIONS: Current Outpatient Medications (Ophthalmic Drugs)  Medication Sig  . Brimonidine Tartrate-Timolol (COMBIGAN OP) Apply to eye.  . latanoprost (XALATAN) 0.005 % ophthalmic solution Place 1 drop into both eyes at bedtime.  . SYSTANE ULTRA 0.4-0.3 % SOLN SMARTSIG:1 Drop(s) In Eye(s) PRN   No current facility-administered medications for this visit. (Ophthalmic Drugs)   Current Outpatient Medications (Other)  Medication Sig  . azaTHIOprine (IMURAN) 50 MG tablet Take 50 mg by mouth daily.  . cycloSPORINE modified (NEORAL) 25 MG capsule Take by mouth.   No current facility-administered medications for this visit. (Other)      REVIEW OF SYSTEMS:    ALLERGIES Allergies  Allergen Reactions  . Penicillins   . Sulfacetamide Sodium     PAST MEDICAL HISTORY History reviewed. No pertinent past medical history. Past Surgical History:  Procedure Laterality Date  . CATARACT EXTRACTION Bilateral 2013   Dr. Talbert Forest    FAMILY HISTORY Family History  Problem Relation Age of Onset  . Amblyopia Neg Hx   . Blindness Neg Hx   .  Cancer Neg Hx   . Cataracts Neg Hx   . Diabetes Neg Hx   . Glaucoma Neg Hx   . Hypertension Neg Hx   . Macular degeneration Neg Hx   . Retinal detachment Neg Hx   . Strabismus Neg Hx   . Stroke Neg Hx   . Thyroid disease Neg Hx   . Retinitis pigmentosa Neg Hx     SOCIAL HISTORY Social History   Tobacco Use  . Smoking status: Never Smoker  . Smokeless tobacco: Never Used  Substance Use Topics  . Alcohol use: Yes    Alcohol/week: 1.0 standard drinks    Types: 1 Glasses of wine per week  . Drug use: Never         OPHTHALMIC EXAM: Base Eye Exam    Visual Acuity (Snellen - Linear)      Right Left   Dist [place] 20/30 + 20/40 -2   Dist ph [facility]  NI       Tonometry (Tonopen, 8:17 AM)      Right Left   Pressure 8 9       Pupils      Pupils Dark Light Shape React APD   Right PERRL 2.5 2 Round Slow +1   Left PERRL 2.5 2 Round Slow None       Visual Fields (  Counting fingers)      Left Right    Full    Restrictions  Total superior temporal, inferior temporal, superior nasal, inferior nasal deficiencies       Neuro/Psych    Oriented x3: Yes   Mood/Affect: Normal       Dilation    Both eyes: 1.0% Mydriacyl, 2.5% Phenylephrine @ 8:17 AM        Slit Lamp and Fundus Exam    Slit Lamp Exam      Right Left   Lens Posterior chamber intraocular lens           IMAGING AND PROCEDURES  Imaging and Procedures for @TODAY @           ASSESSMENT/PLAN:  No problem-specific Assessment & Plan notes found for this encounter.      ICD-10-CM   1. Exudative age-related macular degeneration of right eye with active choroidal neovascularization (HCC)  H35.3211 OCT, Retina - OU - Both Eyes  2. Serous detachment of retinal pigment epithelium of right eye  H35.721 OCT, Retina - OU - Both Eyes  3. Serous detachment of retinal pigment epithelium of left eye  H35.722 OCT, Retina - OU - Both Eyes    1.  2.  3.  Ophthalmic Meds Ordered this visit:  No orders of the  defined types were placed in this encounter.      No follow-ups on file.  There are no Patient Instructions on file for this visit.   Explained the diagnoses, plan, and follow up with the patient and they expressed understanding.  Patient expressed understanding of the importance of proper follow up care.   Clent Demark  M.D. Diseases & Surgery of the Retina and Vitreous Retina & Diabetic [place] @TODAY @     Abbreviations: M myopia (nearsighted); A astigmatism; H hyperopia (farsighted); P presbyopia; Mrx spectacle prescription;  CTL contact lenses; OD right eye; OS left eye; OU both eyes  XT exotropia; ET esotropia; PEK punctate epithelial keratitis; PEE punctate epithelial erosions; DES dry eye syndrome; MGD meibomian gland dysfunction; ATs artificial tears; PFAT's preservative free artificial tears; [place] nuclear sclerotic cataract; PSC posterior subcapsular cataract; ERM epi-retinal membrane; PVD posterior vitreous detachment; RD retinal detachment; DM diabetes mellitus; DR diabetic retinopathy; NPDR non-proliferative diabetic retinopathy; PDR proliferative diabetic retinopathy; CSME clinically significant macular edema; DME diabetic macular edema; dbh dot blot hemorrhages; CWS cotton wool spot; POAG primary open angle glaucoma; C/D cup-to-disc ratio; HVF humphrey visual field; GVF goldmann visual field; OCT optical coherence tomography; IOP intraocular pressure; BRVO Branch retinal vein occlusion; CRVO central retinal vein occlusion; CRAO central retinal artery occlusion; BRAO branch retinal artery occlusion; RT retinal tear; SB scleral buckle; PPV pars plana vitrectomy; VH Vitreous hemorrhage; PRP panretinal laser photocoagulation; IVK intravitreal kenalog; VMT vitreomacular traction; MH Macular hole;  NVD neovascularization of the disc; NVE neovascularization elsewhere; AREDS age related eye disease study; ARMD age related macular degeneration; POAG primary open angle glaucoma; EBMD  epithelial/anterior basement membrane dystrophy; ACIOL anterior chamber intraocular lens; IOL intraocular lens; PCIOL posterior chamber intraocular lens; Phaco/IOL phacoemulsification with intraocular lens placement; [place] photorefractive keratectomy; LASIK laser assisted in situ keratomileusis; HTN hypertension; DM diabetes mellitus; COPD chronic obstructive pulmonary disease

## 2020-02-21 ENCOUNTER — Ambulatory Visit (INDEPENDENT_AMBULATORY_CARE_PROVIDER_SITE_OTHER): Payer: Medicare Other | Admitting: Ophthalmology

## 2020-02-21 ENCOUNTER — Encounter (INDEPENDENT_AMBULATORY_CARE_PROVIDER_SITE_OTHER): Payer: Self-pay | Admitting: Ophthalmology

## 2020-02-21 ENCOUNTER — Other Ambulatory Visit: Payer: Self-pay

## 2020-02-21 ENCOUNTER — Encounter: Payer: Self-pay | Admitting: Family Medicine

## 2020-02-21 ENCOUNTER — Telehealth (INDEPENDENT_AMBULATORY_CARE_PROVIDER_SITE_OTHER): Payer: Medicare Other | Admitting: Family Medicine

## 2020-02-21 VITALS — Temp 97.8°F | Wt 155.0 lb

## 2020-02-21 DIAGNOSIS — H353211 Exudative age-related macular degeneration, right eye, with active choroidal neovascularization: Secondary | ICD-10-CM

## 2020-02-21 DIAGNOSIS — G4733 Obstructive sleep apnea (adult) (pediatric): Secondary | ICD-10-CM

## 2020-02-21 DIAGNOSIS — H353290 Exudative age-related macular degeneration, unspecified eye, stage unspecified: Secondary | ICD-10-CM | POA: Diagnosis not present

## 2020-02-21 DIAGNOSIS — H35722 Serous detachment of retinal pigment epithelium, left eye: Secondary | ICD-10-CM | POA: Diagnosis not present

## 2020-02-21 DIAGNOSIS — H35721 Serous detachment of retinal pigment epithelium, right eye: Secondary | ICD-10-CM

## 2020-02-21 NOTE — Assessment & Plan Note (Signed)
Patient does have obstructive sleep apnea with over 17 AHI events per hour on average.  Definitive therapy has yet to begin.  He is asking questions about whether nightly oxygen supplementation may be appropriate.  I have encouraged either the oral mouth device to be effectively use perhaps with oxygen but more importantly consideration of the CPAP as experience as proven in many patients in this practice to benefit not only the retinal perfusion, macular perfusion and overall sense of wellbeing

## 2020-02-21 NOTE — Progress Notes (Signed)
02/21/2020     CHIEF COMPLAINT Patient presents for Retina Follow Up   HISTORY OF PRESENT ILLNESS: Aaron Bullock is a 79 y.o. male who presents to the clinic today for:   HPI    Retina Follow Up    Patient presents with  Dry AMD.  In both eyes.  Severity is moderate.  Duration of 4 weeks.  Since onset it is stable.  I, the attending physician,  performed the HPI with the patient and updated documentation appropriately.          Comments    4 Week f\u OU. OCT  Pt states no changes or issues since last visit.       Last edited by [name] on 02/21/2020  8:08 AM. (History)      Referring physician: Denita Lung, [place],  [place] [address]  HISTORICAL INFORMATION:   Selected notes from the MEDICAL RECORD NUMBER    Lab Results  Component Value Date   HGBA1C 5.4 10/14/2018     CURRENT MEDICATIONS: Current Outpatient Medications (Ophthalmic Drugs)  Medication Sig  . Brimonidine Tartrate-Timolol (COMBIGAN OP) Apply to eye.  . latanoprost (XALATAN) 0.005 % ophthalmic solution Place 1 drop into both eyes at bedtime.  . SYSTANE ULTRA 0.4-0.3 % SOLN SMARTSIG:1 Drop(s) In Eye(s) PRN   No current facility-administered medications for this visit. (Ophthalmic Drugs)   Current Outpatient Medications (Other)  Medication Sig  . azaTHIOprine (IMURAN) 50 MG tablet Take 50 mg by mouth daily.  . cycloSPORINE modified (NEORAL) 25 MG capsule Take by mouth.   No current facility-administered medications for this visit. (Other)      REVIEW OF SYSTEMS:    ALLERGIES Allergies  Allergen Reactions  . Penicillins   . Sulfacetamide Sodium     PAST MEDICAL HISTORY History reviewed. No pertinent past medical history. Past Surgical History:  Procedure Laterality Date  . CATARACT EXTRACTION Bilateral 2013   Dr. Talbert Forest    FAMILY HISTORY Family History  Problem Relation Age of Onset  . Amblyopia Neg Hx   . Blindness Neg Hx   . Cancer  Neg Hx   . Cataracts Neg Hx   . Diabetes Neg Hx   . Glaucoma Neg Hx   . Hypertension Neg Hx   . Macular degeneration Neg Hx   . Retinal detachment Neg Hx   . Strabismus Neg Hx   . Stroke Neg Hx   . Thyroid disease Neg Hx   . Retinitis pigmentosa Neg Hx     SOCIAL HISTORY Social History   Tobacco Use  . Smoking status: Never Smoker  . Smokeless tobacco: Never Used  Substance Use Topics  . Alcohol use: Yes    Alcohol/week: 1.0 standard drinks    Types: 1 Glasses of wine per week  . Drug use: Never         OPHTHALMIC EXAM:  Base Eye Exam    Visual Acuity (Snellen - Linear)      Right Left   Dist [place] 20/30 20/40   Dist [facility]  NI       Tonometry (Tonopen, 8:11 AM)      Right Left   Pressure 9 10       Pupils      Pupils Dark Light Shape React APD   Right PERRL 3 2 Round Slow +1   Left PERRL 3 2 Round Slow None       Visual Fields (Counting fingers)  Left Right    Full    Restrictions  Partial outer superior temporal, inferior temporal, superior nasal, inferior nasal deficiencies       Neuro/Psych    Oriented x3: Yes   Mood/Affect: Normal        Slit Lamp and Fundus Exam    External Exam      Right Left   External Normal Normal       Slit Lamp Exam      Right Left   Lids/Lashes Normal Normal   Conjunctiva/Sclera White and quiet White and quiet   Cornea Clear Clear   Anterior Chamber Deep and quiet, no cells Deep and quiet, no cells   Iris Round and reactive Round and reactive   Lens Posterior chamber intraocular lens Posterior chamber intraocular lens   Anterior Vitreous Normal Normal       Fundus Exam      Right Left   Posterior Vitreous  Normal, no cells   Disc  Normal   C/D Ratio  0.3   Macula  Soft drusen   Vessels  Normal   Periphery  Normal          IMAGING AND PROCEDURES  Imaging and Procedures for 02/21/20  OCT, Retina - OU - Both Eyes       Right Eye Quality was good. Scan locations included subfoveal. Central  Foveal Thickness: 364.   Left Eye Quality was good. Scan locations included subfoveal. Central Foveal Thickness: 387.                 ASSESSMENT/PLAN:  No problem-specific Assessment & Plan notes found for this encounter.      ICD-10-CM   1. Exudative age-related macular degeneration of right eye with active choroidal neovascularization (HCC)  H35.3211 OCT, Retina - OU - Both Eyes  2. Serous detachment of retinal pigment epithelium of left eye  H35.722 OCT, Retina - OU - Both Eyes  3. Serous detachment of retinal pigment epithelium of right eye  H35.721 OCT, Retina - OU - Both Eyes    1.  Patient is desiring a sleep apnea test to be repeated this time with oral mouth device to check its this and assess its functionality.  I have also asked him to consider getting a nightly oxygen oximeter which will record and return on a application what is nightly oxygen saturations are for home use if he chooses the oral dental device  2.  Should the patient commence with definitive therapy with oral dental device and oxygen supplementation or with CPAP I have asked that he return within 1 to 2 weeks prior to its nightly use and then again 1 to 2 weeks after commencing its use so that we can assess the pre and and post therapeutic changes on his macula  3.  Ophthalmic Meds Ordered this visit:  No orders of the defined types were placed in this encounter.      No follow-ups on file.  There are no Patient Instructions on file for this visit.   Explained the diagnoses, plan, and follow up with the patient and they expressed understanding.  Patient expressed understanding of the importance of proper follow up care.   Clent Demark  M.D. Diseases & Surgery of the Retina and Vitreous Retina & Diabetic [place] 02/21/20     Abbreviations: M myopia (nearsighted); A astigmatism; H hyperopia (farsighted); P presbyopia; Mrx spectacle prescription;  CTL contact lenses; OD right eye; OS  left eye; OU both eyes  XT exotropia; ET esotropia; PEK punctate epithelial keratitis; PEE punctate epithelial erosions; DES dry eye syndrome; MGD meibomian gland dysfunction; ATs artificial tears; PFAT's preservative free artificial tears; NSC nuclear sclerotic cataract; PSC posterior subcapsular cataract; ERM epi-retinal membrane; PVD posterior vitreous detachment; RD retinal detachment; DM diabetes mellitus; DR diabetic retinopathy; NPDR non-proliferative diabetic retinopathy; PDR proliferative diabetic retinopathy; CSME clinically significant macular edema; DME diabetic macular edema; dbh dot blot hemorrhages; CWS cotton wool spot; POAG primary open angle glaucoma; C/D cup-to-disc ratio; HVF humphrey visual field; GVF goldmann visual field; OCT optical coherence tomography; IOP intraocular pressure; BRVO Branch retinal vein occlusion; CRVO central retinal vein occlusion; CRAO central retinal artery occlusion; BRAO branch retinal artery occlusion; RT retinal tear; SB scleral buckle; PPV pars plana vitrectomy; VH Vitreous hemorrhage; PRP panretinal laser photocoagulation; IVK intravitreal kenalog; VMT vitreomacular traction; MH Macular hole;  NVD neovascularization of the disc; NVE neovascularization elsewhere; AREDS age related eye disease study; ARMD age related macular degeneration; POAG primary open angle glaucoma; EBMD epithelial/anterior basement membrane dystrophy; ACIOL anterior chamber intraocular lens; IOL intraocular lens; PCIOL posterior chamber intraocular lens; Phaco/IOL phacoemulsification with intraocular lens placement; PRK photorefractive keratectomy; LASIK laser assisted in situ keratomileusis; HTN hypertension; DM diabetes mellitus; COPD chronic obstructive pulmonary disease 

## 2020-02-21 NOTE — Progress Notes (Signed)
   Subjective:    Patient ID: Aaron Bullock, male    DOB: 09-Jan-1941, 79 y.o.   MRN: [id number]  HPI Interactive audio and video telecommunications were attempted between this provider and patient, however he  did not have access to video capability.  We continued and completed visit with audio only. The patient was located at home. The provider was located in the office. The patient did consent to this visit and is aware of possible charges through their insurance for this visit. The other persons participating in this telemedicine service were none. Time spent on call was 8 minutes  This virtual service is not related to other E/M service within previous 7 days. 2 days visit is to discuss OSA.  He does indeed have OSA and has been using a dental appliance.  He did have less than 2-minute episodes of desaturation.  He was scheduled for follow-up for sleep study to evaluate the efficacy of the appliance but that has not been set up yet.  Apparently multiple attempts were made to reach him without success.  He does have macular degeneration and his ophthalmologist, Dr. Zadie Bullock wants him to use a oxygen concentrator to see if this will help with his macular degeneration.  He is willing to pay out-of-pocket for this.    Review of Systems     Objective:   Physical Exam Alert and in no distress otherwise not examined       Assessment & Plan:  OSA (obstructive sleep apnea)  Exudative age-related macular degeneration, unspecified laterality, unspecified stage ([place]) I will write a prescription for the oxygen concentrator.  We will also have the sleep lab call him to set up for a follow-up sleep study to evaluate the efficacy of the dental appliance.  He will also follow-up with Dr. Zadie Bullock concerning pre and post photos of his retina to see if the oxygen concentrator did help.

## 2020-04-19 DIAGNOSIS — H353211 Exudative age-related macular degeneration, right eye, with active choroidal neovascularization: Secondary | ICD-10-CM | POA: Diagnosis not present

## 2020-04-19 DIAGNOSIS — H30033 Focal chorioretinal inflammation, peripheral, bilateral: Secondary | ICD-10-CM | POA: Diagnosis not present

## 2020-04-19 DIAGNOSIS — Z961 Presence of intraocular lens: Secondary | ICD-10-CM | POA: Diagnosis not present

## 2020-04-19 DIAGNOSIS — H3581 Retinal edema: Secondary | ICD-10-CM | POA: Diagnosis not present

## 2020-04-19 DIAGNOSIS — H353122 Nonexudative age-related macular degeneration, left eye, intermediate dry stage: Secondary | ICD-10-CM | POA: Diagnosis not present

## 2020-04-19 DIAGNOSIS — Z79899 Other long term (current) drug therapy: Secondary | ICD-10-CM | POA: Diagnosis not present

## 2020-05-23 ENCOUNTER — Ambulatory Visit (INDEPENDENT_AMBULATORY_CARE_PROVIDER_SITE_OTHER): Payer: Medicare Other | Admitting: Ophthalmology

## 2020-05-23 ENCOUNTER — Other Ambulatory Visit: Payer: Self-pay

## 2020-05-23 ENCOUNTER — Encounter (INDEPENDENT_AMBULATORY_CARE_PROVIDER_SITE_OTHER): Payer: Self-pay | Admitting: Ophthalmology

## 2020-05-23 DIAGNOSIS — H35722 Serous detachment of retinal pigment epithelium, left eye: Secondary | ICD-10-CM

## 2020-05-23 DIAGNOSIS — H401213 Low-tension glaucoma, right eye, severe stage: Secondary | ICD-10-CM | POA: Diagnosis not present

## 2020-05-23 DIAGNOSIS — H35721 Serous detachment of retinal pigment epithelium, right eye: Secondary | ICD-10-CM

## 2020-05-23 DIAGNOSIS — H353211 Exudative age-related macular degeneration, right eye, with active choroidal neovascularization: Secondary | ICD-10-CM

## 2020-05-23 DIAGNOSIS — H353212 Exudative age-related macular degeneration, right eye, with inactive choroidal neovascularization: Secondary | ICD-10-CM | POA: Diagnosis not present

## 2020-05-23 DIAGNOSIS — D8683 Sarcoid iridocyclitis: Secondary | ICD-10-CM | POA: Diagnosis not present

## 2020-05-23 NOTE — Assessment & Plan Note (Signed)
Last injection OD for wet AMD was October 2020.  No interval change over that time.

## 2020-05-23 NOTE — Progress Notes (Signed)
05/23/2020     CHIEF COMPLAINT Patient presents for Retina Follow Up   HISTORY OF PRESENT ILLNESS: Aaron Bullock is a 79 y.o. male who presents to the clinic today for:   HPI    Retina Follow Up    Patient presents with  Wet AMD.  In right eye.  Severity is moderate.  Duration of 3 months.  Since onset it is stable.  I, the attending physician,  performed the HPI with the patient and updated documentation appropriately.          Comments    3 Month f\u OU. OCT  Pt states no changes or issues.       Last edited by [name] on 05/23/2020  8:19 AM. (History)      Referring physician: Denita Lung, [place],  [place] [address]  HISTORICAL INFORMATION:   Selected notes from the MEDICAL RECORD NUMBER    Lab Results  Component Value Date   HGBA1C 5.4 10/14/2018     CURRENT MEDICATIONS: Current Outpatient Medications (Ophthalmic Drugs)  Medication Sig  . Brimonidine Tartrate-Timolol (COMBIGAN OP) Apply to eye.  . latanoprost (XALATAN) 0.005 % ophthalmic solution Place 1 drop into both eyes at bedtime.  . SYSTANE ULTRA 0.4-0.3 % SOLN SMARTSIG:1 Drop(s) In Eye(s) PRN   No current facility-administered medications for this visit. (Ophthalmic Drugs)   Current Outpatient Medications (Other)  Medication Sig  . azaTHIOprine (IMURAN) 50 MG tablet Take 50 mg by mouth daily.  . cycloSPORINE modified (NEORAL) 25 MG capsule Take by mouth.   No current facility-administered medications for this visit. (Other)      REVIEW OF SYSTEMS:    ALLERGIES Allergies  Allergen Reactions  . Penicillins   . Sulfacetamide Sodium     PAST MEDICAL HISTORY History reviewed. No pertinent past medical history. Past Surgical History:  Procedure Laterality Date  . CATARACT EXTRACTION Bilateral 2013   Dr. Talbert Forest    FAMILY HISTORY Family History  Problem Relation Age of Onset  . Amblyopia Neg Hx   . Blindness Neg Hx   . Cancer Neg Hx   .  Cataracts Neg Hx   . Diabetes Neg Hx   . Glaucoma Neg Hx   . Hypertension Neg Hx   . Macular degeneration Neg Hx   . Retinal detachment Neg Hx   . Strabismus Neg Hx   . Stroke Neg Hx   . Thyroid disease Neg Hx   . Retinitis pigmentosa Neg Hx     SOCIAL HISTORY Social History   Tobacco Use  . Smoking status: Never Smoker  . Smokeless tobacco: Never Used  Substance Use Topics  . Alcohol use: Yes    Alcohol/week: 1.0 standard drink    Types: 1 Glasses of wine per week  . Drug use: Never         OPHTHALMIC EXAM:  Base Eye Exam    Visual Acuity (Snellen - Linear)      Right Left   Dist [place] 20/30 20/30 -1       Tonometry (Tonopen, 8:15 AM)      Right Left   Pressure 16 17       Pupils      Pupils Dark Light Shape React APD   Right PERRL 3 2 Round Slow +1   Left PERRL 3 2 Round Slow None       Neuro/Psych    Oriented x3: Yes   Mood/Affect: Normal  Slit Lamp and Fundus Exam    External Exam      Right Left   External Normal Normal       Slit Lamp Exam      Right Left   Lids/Lashes Normal Normal   Conjunctiva/Sclera White and quiet White and quiet   Cornea Clear Clear   Anterior Chamber Deep and quiet, no cells Deep and quiet, no cells   Iris Round and reactive Round and reactive   Lens Posterior chamber intraocular lens Posterior chamber intraocular lens   Anterior Vitreous Normal,, Normal,,          IMAGING AND PROCEDURES  Imaging and Procedures for 05/23/20  OCT, Retina - OU - Both Eyes       Right Eye Quality was good. Scan locations included subfoveal. Central Foveal Thickness: 370. Progression has improved. Findings include abnormal foveal contour, retinal drusen , subretinal fluid.   Left Eye Quality was good. Scan locations included subfoveal. Central Foveal Thickness: 375. Progression has improved. Findings include abnormal foveal contour, retinal drusen , subretinal fluid.   Notes Serous retinal detachment overlying large  drusenoid pigment epithelial detachments, soft drusen in the macular region OU.  No interval change in either eye over the last 3 months with retained good central acuity, will observe                ASSESSMENT/PLAN:  Exudative age-related macular degeneration of right eye with active choroidal neovascularization (HCC) Last injection OD for wet AMD was October 2020.  No interval change over that time.  Exudative age-related macular degeneration of right eye with inactive choroidal neovascularization (HCC) OD, stable with no interval changes, very similar findings to the left eye with the PreserVision of acuity.  Sarcoid uveitis of right eye No signs of inflammation OD today, and patient is followed by Dr. Hilarie Fredrickson      ICD-10-CM   1. Exudative age-related macular degeneration of right eye with active choroidal neovascularization (HCC)  H35.3211 OCT, Retina - OU - Both Eyes  2. Low tension glaucoma of right eye, severe stage  H40.1213   3. Serous detachment of retinal pigment epithelium of left eye  H35.722   4. Serous detachment of retinal pigment epithelium of right eye  H35.721   5. Exudative age-related macular degeneration of right eye with inactive choroidal neovascularization ([place])  H35.3212   6. Sarcoid uveitis of right eye  D86.83     1.  OU will dilate next, with OCT and color pictures  2.  3.  Ophthalmic Meds Ordered this visit:  No orders of the defined types were placed in this encounter.      Return in about 3 months (around 08/23/2020) for DILATE OU, COLOR FP, OCT.  There are no Patient Instructions on file for this visit.   Explained the diagnoses, plan, and follow up with the patient and they expressed understanding.  Patient expressed understanding of the importance of proper follow up care.   Aaron Bullock  M.D. Diseases & Surgery of the Retina and Vitreous Retina & Diabetic [place] 05/23/20     Abbreviations: M myopia (nearsighted); A  astigmatism; H hyperopia (farsighted); P presbyopia; Mrx spectacle prescription;  CTL contact lenses; OD right eye; OS left eye; OU both eyes  XT exotropia; ET esotropia; PEK punctate epithelial keratitis; PEE punctate epithelial erosions; DES dry eye syndrome; MGD meibomian gland dysfunction; ATs artificial tears; PFAT's preservative free artificial tears; [place] nuclear sclerotic cataract; PSC posterior subcapsular cataract; ERM epi-retinal  membrane; PVD posterior vitreous detachment; RD retinal detachment; DM diabetes mellitus; DR diabetic retinopathy; NPDR non-proliferative diabetic retinopathy; PDR proliferative diabetic retinopathy; CSME clinically significant macular edema; DME diabetic macular edema; dbh dot blot hemorrhages; CWS cotton wool spot; POAG primary open angle glaucoma; C/D cup-to-disc ratio; HVF humphrey visual field; GVF goldmann visual field; OCT optical coherence tomography; IOP intraocular pressure; BRVO Branch retinal vein occlusion; CRVO central retinal vein occlusion; CRAO central retinal artery occlusion; BRAO branch retinal artery occlusion; RT retinal tear; SB scleral buckle; PPV pars plana vitrectomy; VH Vitreous hemorrhage; PRP panretinal laser photocoagulation; IVK intravitreal kenalog; VMT vitreomacular traction; MH Macular hole;  NVD neovascularization of the disc; NVE neovascularization elsewhere; AREDS age related eye disease study; ARMD age related macular degeneration; POAG primary open angle glaucoma; EBMD epithelial/anterior basement membrane dystrophy; ACIOL anterior chamber intraocular lens; IOL intraocular lens; PCIOL posterior chamber intraocular lens; Phaco/IOL phacoemulsification with intraocular lens placement; [place] photorefractive keratectomy; LASIK laser assisted in situ keratomileusis; HTN hypertension; DM diabetes mellitus; COPD chronic obstructive pulmonary disease

## 2020-05-23 NOTE — Assessment & Plan Note (Signed)
No signs of inflammation OD today, and patient is followed by Dr. Hilarie Fredrickson

## 2020-05-23 NOTE — Assessment & Plan Note (Addendum)
OD, stable with no interval changes, very similar findings to the left eye with the PreserVision of acuity.

## 2020-06-25 DIAGNOSIS — H04122 Dry eye syndrome of left lacrimal gland: Secondary | ICD-10-CM | POA: Diagnosis not present

## 2020-06-25 DIAGNOSIS — H26492 Other secondary cataract, left eye: Secondary | ICD-10-CM | POA: Diagnosis not present

## 2020-06-25 DIAGNOSIS — H353212 Exudative age-related macular degeneration, right eye, with inactive choroidal neovascularization: Secondary | ICD-10-CM | POA: Diagnosis not present

## 2020-06-25 DIAGNOSIS — H401133 Primary open-angle glaucoma, bilateral, severe stage: Secondary | ICD-10-CM | POA: Diagnosis not present

## 2020-08-20 DIAGNOSIS — H353211 Exudative age-related macular degeneration, right eye, with active choroidal neovascularization: Secondary | ICD-10-CM | POA: Diagnosis not present

## 2020-08-20 DIAGNOSIS — H3581 Retinal edema: Secondary | ICD-10-CM | POA: Diagnosis not present

## 2020-08-20 DIAGNOSIS — Z961 Presence of intraocular lens: Secondary | ICD-10-CM | POA: Diagnosis not present

## 2020-08-20 DIAGNOSIS — H353122 Nonexudative age-related macular degeneration, left eye, intermediate dry stage: Secondary | ICD-10-CM | POA: Diagnosis not present

## 2020-08-20 DIAGNOSIS — H30033 Focal chorioretinal inflammation, peripheral, bilateral: Secondary | ICD-10-CM | POA: Diagnosis not present

## 2020-08-20 DIAGNOSIS — Z79899 Other long term (current) drug therapy: Secondary | ICD-10-CM | POA: Diagnosis not present

## 2020-08-26 ENCOUNTER — Other Ambulatory Visit: Payer: Self-pay

## 2020-08-26 ENCOUNTER — Encounter (INDEPENDENT_AMBULATORY_CARE_PROVIDER_SITE_OTHER): Payer: Self-pay | Admitting: Ophthalmology

## 2020-08-26 ENCOUNTER — Ambulatory Visit (INDEPENDENT_AMBULATORY_CARE_PROVIDER_SITE_OTHER): Payer: Medicare Other | Admitting: Ophthalmology

## 2020-08-26 DIAGNOSIS — H401113 Primary open-angle glaucoma, right eye, severe stage: Secondary | ICD-10-CM

## 2020-08-26 DIAGNOSIS — H353211 Exudative age-related macular degeneration, right eye, with active choroidal neovascularization: Secondary | ICD-10-CM | POA: Diagnosis not present

## 2020-08-26 DIAGNOSIS — H353212 Exudative age-related macular degeneration, right eye, with inactive choroidal neovascularization: Secondary | ICD-10-CM | POA: Diagnosis not present

## 2020-08-26 DIAGNOSIS — D8683 Sarcoid iridocyclitis: Secondary | ICD-10-CM

## 2020-08-26 DIAGNOSIS — G4733 Obstructive sleep apnea (adult) (pediatric): Secondary | ICD-10-CM | POA: Diagnosis not present

## 2020-08-26 DIAGNOSIS — H35722 Serous detachment of retinal pigment epithelium, left eye: Secondary | ICD-10-CM | POA: Diagnosis not present

## 2020-08-26 NOTE — Assessment & Plan Note (Signed)
Stable OS for many years, good acuity no progression, observe

## 2020-08-26 NOTE — Assessment & Plan Note (Signed)
Only occasionally wears the oral device at night for sleeping

## 2020-08-26 NOTE — Assessment & Plan Note (Signed)
With advanced cupping on the right I have encouraged the patient to continue to use nighttime efforts minimizing hypoxic occurrences from obstructive sleep apnea, using oral device or other measures as needed

## 2020-08-26 NOTE — Progress Notes (Signed)
08/26/2020     CHIEF COMPLAINT Patient presents for Retina Follow Up   HISTORY OF PRESENT ILLNESS: Aaron Bullock is a 79 y.o. male who presents to the clinic today for:   HPI    Retina Follow Up    Patient presents with  Wet AMD.  In right eye.  This started 3 months ago.  Severity is mild.  Duration of 3 months.  Since onset it is stable.          Comments    3 Month F/U OU,, no interval changes  Pt denies noticeable changes to [place] OU since last visit. Pt denies ocular pain, flashes of light, or floaters OU.         Last edited by Hurman Horn, MD on 08/26/2020  8:21 AM. (History)      Referring physician: Denita Lung, MD [place],  [place] [id number]  HISTORICAL INFORMATION:   Selected notes from the MEDICAL RECORD NUMBER    Lab Results  Component Value Date   HGBA1C 5.4 10/14/2018     CURRENT MEDICATIONS: Current Outpatient Medications (Ophthalmic Drugs)  Medication Sig  . Brimonidine Tartrate-Timolol (COMBIGAN OP) Apply to eye.  . latanoprost (XALATAN) 0.005 % ophthalmic solution Place 1 drop into both eyes at bedtime.  . SYSTANE ULTRA 0.4-0.3 % SOLN SMARTSIG:1 Drop(s) In Eye(s) PRN   No current facility-administered medications for this visit. (Ophthalmic Drugs)   Current Outpatient Medications (Other)  Medication Sig  . azaTHIOprine (IMURAN) 50 MG tablet Take 50 mg by mouth daily.  . cycloSPORINE modified (NEORAL) 25 MG capsule Take by mouth.   No current facility-administered medications for this visit. (Other)      REVIEW OF SYSTEMS:    ALLERGIES Allergies  Allergen Reactions  . Penicillins   . Sulfacetamide Sodium     PAST MEDICAL HISTORY History reviewed. No pertinent past medical history. Past Surgical History:  Procedure Laterality Date  . CATARACT EXTRACTION Bilateral 2013   Dr. Talbert Forest    FAMILY HISTORY Family History  Problem Relation Age of Onset  . Amblyopia Neg Hx   . Blindness Neg Hx   .  Cancer Neg Hx   . Cataracts Neg Hx   . Diabetes Neg Hx   . Glaucoma Neg Hx   . Hypertension Neg Hx   . Macular degeneration Neg Hx   . Retinal detachment Neg Hx   . Strabismus Neg Hx   . Stroke Neg Hx   . Thyroid disease Neg Hx   . Retinitis pigmentosa Neg Hx     SOCIAL HISTORY Social History   Tobacco Use  . Smoking status: Never Smoker  . Smokeless tobacco: Never Used  Substance Use Topics  . Alcohol use: Yes    Alcohol/week: 1.0 standard drink    Types: 1 Glasses of wine per week  . Drug use: Never         OPHTHALMIC EXAM:  Base Eye Exam    Visual Acuity (ETDRS)      Right Left   Dist [place] 20/25 -1 20/25 -2       Tonometry (Tonopen, 8:05 AM)      Right Left   Pressure 12 14       Pupils      Pupils Dark Light Shape React APD   Right PERRL 2 1 Round Minimal None   Left PERRL 2 1 Round Minimal None       Visual Fields (Counting fingers)  Left Right    Full    Restrictions  Partial outer superior temporal, inferior temporal, superior nasal, inferior nasal deficiencies       Extraocular Movement      Right Left    Full Full       Neuro/Psych    Oriented x3: Yes   Mood/Affect: Normal       Dilation    Both eyes: 1.0% Mydriacyl, 2.5% Phenylephrine @ 8:08 AM        Slit Lamp and Fundus Exam    External Exam      Right Left   External Normal Normal       Slit Lamp Exam      Right Left   Lids/Lashes Normal Normal   Conjunctiva/Sclera White and quiet White and quiet   Cornea Clear Clear   Anterior Chamber Deep and quiet, no cells Deep and quiet, no cells   Iris Round and reactive Round and reactive   Lens Posterior chamber intraocular lens, Open posterior capsule Posterior chamber intraocular lens, 1+ Posterior capsular opacification   Anterior Vitreous Normal,, no cells Normal,,       Fundus Exam      Right Left   Posterior Vitreous Normal Normal, no cells   Disc 1+ Optic disc atrophy, 1+ Pallor Normal   C/D Ratio 0.85 0.3    Macula Soft confluent drusen, Advanced age related macular degeneration, no exudates, no macular thickening, no hemorrhage Soft confluent drusen, Advanced age related macular degeneration, no exudates, no macular thickening, no hemorrhage   Vessels Normal Normal   Periphery Normal Normal          IMAGING AND PROCEDURES  Imaging and Procedures for 08/26/20  OCT, Retina - OU - Both Eyes       Right Eye Quality was good. Scan locations included subfoveal. Central Foveal Thickness: 384. Progression has been stable. Findings include no IRF, abnormal foveal contour, retinal drusen , subretinal fluid.   Left Eye Quality was good. Scan locations included subfoveal. Central Foveal Thickness: 378. Progression has been stable. Findings include abnormal foveal contour, retinal drusen , no IRF, subretinal fluid.   Notes Subretinal fluid OU persists, no change will continue to observe, with good acuity       Color Fundus Photography Optos - OU - Both Eyes       Right Eye Progression has been stable. Disc findings include increased cup to disc ratio, pallor, thinning of rim. Macula : drusen. Vessels : normal observations. Periphery : normal observations.   Left Eye Progression has improved. Disc findings include normal observations. Macula : drusen. Vessels : normal observations. Periphery : normal observations.   Notes Advanced cupping optic nerve OD, , With large subfoveal drusenoid pigment epithelial detachments, of large soft drusen.  No signs of CNVM, stable                ASSESSMENT/PLAN:  OSA (obstructive sleep apnea) Only occasionally wears the oral device at night for sleeping  Serous detachment of retinal pigment epithelium of left eye Stable OS for many years, good acuity no progression, observe  Sarcoid uveitis of right eye History of sarcoid uveitis OD, not active at this time, off of antimetabolite medication now "for many months ""  Exudative age-related  macular degeneration of right eye with inactive choroidal neovascularization (HCC) Stable OD, no therapeutic intervention OD for chronic serous subfoveal detachment now for over 13 months  Primary open angle glaucoma of right eye, severe stage With advanced cupping  on the right I have encouraged the patient to continue to use nighttime efforts minimizing hypoxic occurrences from obstructive sleep apnea, using oral device or other measures as needed      ICD-10-CM   1. Exudative age-related macular degeneration of right eye with active choroidal neovascularization (HCC)  H35.3211 OCT, Retina - OU - Both Eyes    Color Fundus Photography Optos - OU - Both Eyes  2. OSA (obstructive sleep apnea)  G47.33   3. Serous detachment of retinal pigment epithelium of left eye  H35.722   4. Sarcoid uveitis of right eye  D86.83   5. Exudative age-related macular degeneration of right eye with inactive choroidal neovascularization ([place])  H35.3212   6. Primary open angle glaucoma of right eye, severe stage  H40.1113     1.  Today no specific retinal therapy warranted  2.  I have encouraged patient to continue to use measures for protection against nighttime hypoxemia events namely obstructive sleep apnea using oral device as possible and/or other measures for treatment  3.  Ophthalmic Meds Ordered this visit:  No orders of the defined types were placed in this encounter.      Return in about 3 months (around 11/26/2020) for DILATE OU, OCT.  There are no Patient Instructions on file for this visit.   Explained the diagnoses, plan, and follow up with the patient and they expressed understanding.  Patient expressed understanding of the importance of proper follow up care.   Clent Demark  M.D. Diseases & Surgery of the Retina and Vitreous Retina & Diabetic [place] 08/26/20     Abbreviations: M myopia (nearsighted); A astigmatism; H hyperopia (farsighted); P presbyopia; Mrx spectacle  prescription;  CTL contact lenses; OD right eye; OS left eye; OU both eyes  XT exotropia; ET esotropia; PEK punctate epithelial keratitis; PEE punctate epithelial erosions; DES dry eye syndrome; MGD meibomian gland dysfunction; ATs artificial tears; PFAT's preservative free artificial tears; [place] nuclear sclerotic cataract; PSC posterior subcapsular cataract; ERM epi-retinal membrane; PVD posterior vitreous detachment; RD retinal detachment; DM diabetes mellitus; DR diabetic retinopathy; NPDR non-proliferative diabetic retinopathy; PDR proliferative diabetic retinopathy; CSME clinically significant macular edema; DME diabetic macular edema; dbh dot blot hemorrhages; CWS cotton wool spot; POAG primary open angle glaucoma; C/D cup-to-disc ratio; HVF humphrey visual field; GVF goldmann visual field; OCT optical coherence tomography; IOP intraocular pressure; BRVO Branch retinal vein occlusion; CRVO central retinal vein occlusion; CRAO central retinal artery occlusion; BRAO branch retinal artery occlusion; RT retinal tear; SB scleral buckle; PPV pars plana vitrectomy; VH Vitreous hemorrhage; PRP panretinal laser photocoagulation; IVK intravitreal kenalog; VMT vitreomacular traction; MH Macular hole;  NVD neovascularization of the disc; NVE neovascularization elsewhere; AREDS age related eye disease study; ARMD age related macular degeneration; POAG primary open angle glaucoma; EBMD epithelial/anterior basement membrane dystrophy; ACIOL anterior chamber intraocular lens; IOL intraocular lens; PCIOL posterior chamber intraocular lens; Phaco/IOL phacoemulsification with intraocular lens placement; [place] photorefractive keratectomy; LASIK laser assisted in situ keratomileusis; HTN hypertension; DM diabetes mellitus; COPD chronic obstructive pulmonary disease

## 2020-08-26 NOTE — Assessment & Plan Note (Signed)
History of sarcoid uveitis OD, not active at this time, off of antimetabolite medication now "for many months ""

## 2020-08-26 NOTE — Assessment & Plan Note (Signed)
Stable OD, no therapeutic intervention OD for chronic serous subfoveal detachment now for over 13 months

## 2020-10-01 DIAGNOSIS — H04122 Dry eye syndrome of left lacrimal gland: Secondary | ICD-10-CM | POA: Diagnosis not present

## 2020-10-01 DIAGNOSIS — H353212 Exudative age-related macular degeneration, right eye, with inactive choroidal neovascularization: Secondary | ICD-10-CM | POA: Diagnosis not present

## 2020-10-01 DIAGNOSIS — H353122 Nonexudative age-related macular degeneration, left eye, intermediate dry stage: Secondary | ICD-10-CM | POA: Diagnosis not present

## 2020-10-01 DIAGNOSIS — H401132 Primary open-angle glaucoma, bilateral, moderate stage: Secondary | ICD-10-CM | POA: Diagnosis not present

## 2020-10-14 DIAGNOSIS — H401132 Primary open-angle glaucoma, bilateral, moderate stage: Secondary | ICD-10-CM | POA: Diagnosis not present

## 2020-10-29 DIAGNOSIS — Z85828 Personal history of other malignant neoplasm of skin: Secondary | ICD-10-CM | POA: Diagnosis not present

## 2020-10-29 DIAGNOSIS — L821 Other seborrheic keratosis: Secondary | ICD-10-CM | POA: Diagnosis not present

## 2020-10-29 DIAGNOSIS — L814 Other melanin hyperpigmentation: Secondary | ICD-10-CM | POA: Diagnosis not present

## 2020-10-29 DIAGNOSIS — D225 Melanocytic nevi of trunk: Secondary | ICD-10-CM | POA: Diagnosis not present

## 2020-10-29 DIAGNOSIS — L570 Actinic keratosis: Secondary | ICD-10-CM | POA: Diagnosis not present

## 2020-10-29 DIAGNOSIS — L578 Other skin changes due to chronic exposure to nonionizing radiation: Secondary | ICD-10-CM | POA: Diagnosis not present

## 2020-10-29 DIAGNOSIS — L820 Inflamed seborrheic keratosis: Secondary | ICD-10-CM | POA: Diagnosis not present

## 2020-10-31 DIAGNOSIS — H401132 Primary open-angle glaucoma, bilateral, moderate stage: Secondary | ICD-10-CM | POA: Diagnosis not present

## 2020-10-31 DIAGNOSIS — H353212 Exudative age-related macular degeneration, right eye, with inactive choroidal neovascularization: Secondary | ICD-10-CM | POA: Diagnosis not present

## 2020-10-31 DIAGNOSIS — H04122 Dry eye syndrome of left lacrimal gland: Secondary | ICD-10-CM | POA: Diagnosis not present

## 2020-10-31 DIAGNOSIS — H353122 Nonexudative age-related macular degeneration, left eye, intermediate dry stage: Secondary | ICD-10-CM | POA: Diagnosis not present

## 2020-11-25 ENCOUNTER — Encounter (INDEPENDENT_AMBULATORY_CARE_PROVIDER_SITE_OTHER): Payer: Self-pay | Admitting: Ophthalmology

## 2020-11-25 ENCOUNTER — Other Ambulatory Visit: Payer: Self-pay

## 2020-11-25 ENCOUNTER — Ambulatory Visit (INDEPENDENT_AMBULATORY_CARE_PROVIDER_SITE_OTHER): Payer: Medicare Other | Admitting: Ophthalmology

## 2020-11-25 DIAGNOSIS — H353212 Exudative age-related macular degeneration, right eye, with inactive choroidal neovascularization: Secondary | ICD-10-CM | POA: Diagnosis not present

## 2020-11-25 DIAGNOSIS — H401113 Primary open-angle glaucoma, right eye, severe stage: Secondary | ICD-10-CM

## 2020-11-25 DIAGNOSIS — H35722 Serous detachment of retinal pigment epithelium, left eye: Secondary | ICD-10-CM | POA: Diagnosis not present

## 2020-11-25 DIAGNOSIS — H353211 Exudative age-related macular degeneration, right eye, with active choroidal neovascularization: Secondary | ICD-10-CM

## 2020-11-25 NOTE — Assessment & Plan Note (Signed)
No active CNVM OD, no treatment in the right eye for 1.5 years upon review last injection October 2020  Left eye without therapy in the past

## 2020-11-25 NOTE — Assessment & Plan Note (Signed)
Advanced optic atrophy in the right eye.  I also explained the patient that untreated sleep apnea has deleterious effects to the optic nerve perfusion, which like the most fovea shares a very tenuous vasculature with the aging process but made worse by transient hypotension, hypoxemia and hypertension of untreated sleep apnea

## 2020-11-25 NOTE — Assessment & Plan Note (Signed)
These findings in the subfoveal region of each eye have been stable for now for many years and have encouraged the patient on a physiologic basis to maximize oxygenation of the retina and the outer retina with the choroid eye therapy for his documented AHI of 17/hr on a 1 previous sleep study upon my review again today.  He reports to me that he wants to be retested wearing his old device which which would potentially diminish the obstructive component of his sleep apnea.

## 2020-11-25 NOTE — Progress Notes (Signed)
11/25/2020     CHIEF COMPLAINT Patient presents for Retina Follow Up (3 Month F/U OU//Pt denies noticeable changes to [place] OU since last visit. Pt denies ocular pain, flashes of light, or floaters OU. //)   HISTORY OF PRESENT ILLNESS: Aaron Bullock is a 80 y.o. male who presents to the clinic today for:   HPI    Retina Follow Up    Patient presents with  Wet AMD.  In right eye.  This started 3 months ago.  Severity is mild.  Duration of 3 months.  Since onset it is stable. Additional comments: 3 Month F/U OU  Pt denies noticeable changes to [place] OU since last visit. Pt denies ocular pain, flashes of light, or floaters OU.          Last edited by Rockie Neighbours, [place] on 11/25/2020  8:12 AM. (History)      Referring physician: Denita Lung, MD [place],  [place] [id number]  HISTORICAL INFORMATION:   Selected notes from the MEDICAL RECORD NUMBER    Lab Results  Component Value Date   HGBA1C 5.4 10/14/2018     CURRENT MEDICATIONS: Current Outpatient Medications (Ophthalmic Drugs)  Medication Sig  . Brimonidine Tartrate-Timolol (COMBIGAN OP) Apply to eye.  . latanoprost (XALATAN) 0.005 % ophthalmic solution Place 1 drop into both eyes at bedtime.  . SYSTANE ULTRA 0.4-0.3 % SOLN SMARTSIG:1 Drop(s) In Eye(s) PRN   No current facility-administered medications for this visit. (Ophthalmic Drugs)   Current Outpatient Medications (Other)  Medication Sig  . azaTHIOprine (IMURAN) 50 MG tablet Take 50 mg by mouth daily.  . cycloSPORINE modified (NEORAL) 25 MG capsule Take by mouth.   No current facility-administered medications for this visit. (Other)      REVIEW OF SYSTEMS:    ALLERGIES Allergies  Allergen Reactions  . Penicillins   . Sulfacetamide Sodium     PAST MEDICAL HISTORY History reviewed. No pertinent past medical history. Past Surgical History:  Procedure Laterality Date  . CATARACT EXTRACTION Bilateral 2013   Dr. Talbert Forest     FAMILY HISTORY Family History  Problem Relation Age of Onset  . Amblyopia Neg Hx   . Blindness Neg Hx   . Cancer Neg Hx   . Cataracts Neg Hx   . Diabetes Neg Hx   . Glaucoma Neg Hx   . Hypertension Neg Hx   . Macular degeneration Neg Hx   . Retinal detachment Neg Hx   . Strabismus Neg Hx   . Stroke Neg Hx   . Thyroid disease Neg Hx   . Retinitis pigmentosa Neg Hx     SOCIAL HISTORY Social History   Tobacco Use  . Smoking status: Never Smoker  . Smokeless tobacco: Never Used  Substance Use Topics  . Alcohol use: Yes    Alcohol/week: 1.0 standard drink    Types: 1 Glasses of wine per week  . Drug use: Never         OPHTHALMIC EXAM: Base Eye Exam    Visual Acuity (ETDRS)      Right Left   Dist [place] 20/25 -1 20/30 -2   Dist ph [place]  20/30 +2       Tonometry (Tonopen, 8:12 AM)      Right Left   Pressure 15 12       Pupils      Pupils Dark Light Shape React APD   Right PERRL 2 2 Round Minimal None   Left PERRL  2 2 Round Minimal None       Visual Fields (Counting fingers)      Left Right    Full    Restrictions  Partial outer superior temporal, inferior temporal, superior nasal, inferior nasal deficiencies       Extraocular Movement      Right Left    Full Full       Neuro/Psych    Oriented x3: Yes   Mood/Affect: Normal       Dilation    Both eyes: 1.0% Mydriacyl, 2.5% Phenylephrine @ 8:16 AM        Slit Lamp and Fundus Exam    External Exam      Right Left   External Normal Normal       Slit Lamp Exam      Right Left   Lids/Lashes Normal Normal   Conjunctiva/Sclera White and quiet White and quiet   Cornea Clear Clear   Anterior Chamber Deep and quiet, no cells Deep and quiet, no cells   Iris Round and reactive Round and reactive   Lens Posterior chamber intraocular lens, Open posterior capsule Posterior chamber intraocular lens, 1+ Posterior capsular opacification   Anterior Vitreous Normal,, no cells Normal,,       Fundus Exam       Right Left   Posterior Vitreous Normal Normal, no cells   Disc 1+ Optic disc atrophy, 1+ Pallor Normal   C/D Ratio 0.85 0.3   Macula Soft confluent drusen, Advanced age related macular degeneration, no exudates, no macular thickening, no hemorrhage Soft confluent drusen, Advanced age related macular degeneration, no exudates, no macular thickening, no hemorrhage   Vessels Normal Normal   Periphery Normal Normal          IMAGING AND PROCEDURES  Imaging and Procedures for 11/25/20  OCT, Retina - OU - Both Eyes       Right Eye Quality was good. Scan locations included subfoveal. Central Foveal Thickness: 410. Progression has been stable. Findings include abnormal foveal contour, retinal drusen , subretinal hyper-reflective material.   Left Eye Quality was good. Scan locations included subfoveal. Central Foveal Thickness: 406. Progression has been stable. Findings include abnormal foveal contour, retinal drusen , subretinal hyper-reflective material.   Notes Bilateral serous retinal detachment with subfoveal location of unchanged now for years.  No signs of active CNVM and preserved good acuity OU                ASSESSMENT/PLAN:  Exudative age-related macular degeneration of right eye with inactive choroidal neovascularization (HCC) No active CNVM OD, no treatment in the right eye for 1.5 years upon review last injection October 2020  Left eye without therapy in the past  Serous detachment of retinal pigment epithelium of left eye These findings in the subfoveal region of each eye have been stable for now for many years and have encouraged the patient on a physiologic basis to maximize oxygenation of the retina and the outer retina with the choroid eye therapy for his documented AHI of 17/hr on a 1 previous sleep study upon my review again today.  He reports to me that he wants to be retested wearing his old device which which would potentially diminish the obstructive  component of his sleep apnea.  Primary open angle glaucoma of right eye, severe stage Advanced optic atrophy in the right eye.  I also explained the patient that untreated sleep apnea has deleterious effects to the optic nerve perfusion, which like the  most fovea shares a very tenuous vasculature with the aging process but made worse by transient hypotension, hypoxemia and hypertension of untreated sleep apnea      ICD-10-CM   1. Exudative age-related macular degeneration of right eye with active choroidal neovascularization (HCC)  H35.3211 OCT, Retina - OU - Both Eyes  2. Exudative age-related macular degeneration of right eye with inactive choroidal neovascularization ([place])  H35.3212   3. Serous detachment of retinal pigment epithelium of left eye  H35.722   4. Primary open angle glaucoma of right eye, severe stage  H40.1113     1.  2.  3.  Ophthalmic Meds Ordered this visit:  No orders of the defined types were placed in this encounter.      Return in about 3 months (around 02/22/2021) for DILATE OU, OCT.  There are no Patient Instructions on file for this visit.   Explained the diagnoses, plan, and follow up with the patient and they expressed understanding.  Patient expressed understanding of the importance of proper follow up care.   Clent Demark  M.D. Diseases & Surgery of the Retina and Vitreous Retina & Diabetic [place] 11/25/20     Abbreviations: M myopia (nearsighted); A astigmatism; H hyperopia (farsighted); P presbyopia; Mrx spectacle prescription;  CTL contact lenses; OD right eye; OS left eye; OU both eyes  XT exotropia; ET esotropia; PEK punctate epithelial keratitis; PEE punctate epithelial erosions; DES dry eye syndrome; MGD meibomian gland dysfunction; ATs artificial tears; PFAT's preservative free artificial tears; [place] nuclear sclerotic cataract; PSC posterior subcapsular cataract; ERM epi-retinal membrane; PVD posterior vitreous detachment; RD retinal  detachment; DM diabetes mellitus; DR diabetic retinopathy; NPDR non-proliferative diabetic retinopathy; PDR proliferative diabetic retinopathy; CSME clinically significant macular edema; DME diabetic macular edema; dbh dot blot hemorrhages; CWS cotton wool spot; POAG primary open angle glaucoma; C/D cup-to-disc ratio; HVF humphrey visual field; GVF goldmann visual field; OCT optical coherence tomography; IOP intraocular pressure; BRVO Branch retinal vein occlusion; CRVO central retinal vein occlusion; CRAO central retinal artery occlusion; BRAO branch retinal artery occlusion; RT retinal tear; SB scleral buckle; PPV pars plana vitrectomy; VH Vitreous hemorrhage; PRP panretinal laser photocoagulation; IVK intravitreal kenalog; VMT vitreomacular traction; MH Macular hole;  NVD neovascularization of the disc; NVE neovascularization elsewhere; AREDS age related eye disease study; ARMD age related macular degeneration; POAG primary open angle glaucoma; EBMD epithelial/anterior basement membrane dystrophy; ACIOL anterior chamber intraocular lens; IOL intraocular lens; PCIOL posterior chamber intraocular lens; Phaco/IOL phacoemulsification with intraocular lens placement; [place] photorefractive keratectomy; LASIK laser assisted in situ keratomileusis; HTN hypertension; DM diabetes mellitus; COPD chronic obstructive pulmonary disease

## 2020-11-25 NOTE — Patient Instructions (Signed)
I asked the patient to follow-up with sleep apnea study but it is a minimum to continue to wear his oral device nightly instead of occasionally.  This might enhance maximum oxygenation until he can complete his repeat sleep study

## 2020-12-20 ENCOUNTER — Telehealth: Payer: Self-pay | Admitting: Family Medicine

## 2020-12-20 DIAGNOSIS — E559 Vitamin D deficiency, unspecified: Secondary | ICD-10-CM

## 2020-12-20 DIAGNOSIS — D8683 Sarcoid iridocyclitis: Secondary | ICD-10-CM

## 2020-12-20 DIAGNOSIS — I2510 Atherosclerotic heart disease of native coronary artery without angina pectoris: Secondary | ICD-10-CM

## 2020-12-20 NOTE — Telephone Encounter (Signed)
Patient called and scheduled Wellness/CPE for 7/22 in the afternoon He wants to come in a few days before cpe for fasting labs, Is that ok and if so can you put orders in

## 2020-12-20 NOTE — Telephone Encounter (Signed)
Done

## 2021-01-21 DIAGNOSIS — Z79899 Other long term (current) drug therapy: Secondary | ICD-10-CM | POA: Diagnosis not present

## 2021-01-21 DIAGNOSIS — H353122 Nonexudative age-related macular degeneration, left eye, intermediate dry stage: Secondary | ICD-10-CM | POA: Diagnosis not present

## 2021-01-21 DIAGNOSIS — H3581 Retinal edema: Secondary | ICD-10-CM | POA: Diagnosis not present

## 2021-01-21 DIAGNOSIS — Z961 Presence of intraocular lens: Secondary | ICD-10-CM | POA: Diagnosis not present

## 2021-01-21 DIAGNOSIS — H30033 Focal chorioretinal inflammation, peripheral, bilateral: Secondary | ICD-10-CM | POA: Diagnosis not present

## 2021-01-21 DIAGNOSIS — H353211 Exudative age-related macular degeneration, right eye, with active choroidal neovascularization: Secondary | ICD-10-CM | POA: Diagnosis not present

## 2021-01-30 ENCOUNTER — Encounter (INDEPENDENT_AMBULATORY_CARE_PROVIDER_SITE_OTHER): Payer: Self-pay

## 2021-02-24 ENCOUNTER — Encounter (INDEPENDENT_AMBULATORY_CARE_PROVIDER_SITE_OTHER): Payer: Medicare Other | Admitting: Ophthalmology

## 2021-02-25 ENCOUNTER — Ambulatory Visit (INDEPENDENT_AMBULATORY_CARE_PROVIDER_SITE_OTHER): Payer: Medicare Other | Admitting: Ophthalmology

## 2021-02-25 ENCOUNTER — Encounter (INDEPENDENT_AMBULATORY_CARE_PROVIDER_SITE_OTHER): Payer: Self-pay | Admitting: Ophthalmology

## 2021-02-25 ENCOUNTER — Other Ambulatory Visit: Payer: Self-pay

## 2021-02-25 DIAGNOSIS — H401113 Primary open-angle glaucoma, right eye, severe stage: Secondary | ICD-10-CM | POA: Diagnosis not present

## 2021-02-25 DIAGNOSIS — H353211 Exudative age-related macular degeneration, right eye, with active choroidal neovascularization: Secondary | ICD-10-CM

## 2021-02-25 DIAGNOSIS — H353212 Exudative age-related macular degeneration, right eye, with inactive choroidal neovascularization: Secondary | ICD-10-CM | POA: Diagnosis not present

## 2021-02-25 DIAGNOSIS — D8683 Sarcoid iridocyclitis: Secondary | ICD-10-CM

## 2021-02-25 DIAGNOSIS — I2510 Atherosclerotic heart disease of native coronary artery without angina pectoris: Secondary | ICD-10-CM | POA: Diagnosis not present

## 2021-02-25 DIAGNOSIS — H3554 Dystrophies primarily involving the retinal pigment epithelium: Secondary | ICD-10-CM | POA: Diagnosis not present

## 2021-02-25 NOTE — Assessment & Plan Note (Signed)
No signs of recurrence OU.

## 2021-02-25 NOTE — Assessment & Plan Note (Signed)
No signs of recurrence OU, quiescent OD

## 2021-02-25 NOTE — Assessment & Plan Note (Signed)
Stable now for many years no signs of CNVM currently

## 2021-02-25 NOTE — Assessment & Plan Note (Signed)
Follow-up with Dr. Talbert Forest as scheduled

## 2021-02-25 NOTE — Progress Notes (Signed)
02/25/2021     CHIEF COMPLAINT Patient presents for Retina Follow Up (3 Mo F/U OU//Pt denies noticeable changes to [place] OU since last visit. Pt denies ocular pain, flashes of light, or floaters OU. //)   HISTORY OF PRESENT ILLNESS: Aaron Bullock is a 80 y.o. male who presents to the clinic today for:   HPI    Retina Follow Up    Diagnosis: Wet AMD   Laterality: right eye   Onset: 3 months ago   Severity: mild   Duration: 3 months   Course: stable   Comments: 3 Mo F/U OU  Pt denies noticeable changes to [place] OU since last visit. Pt denies ocular pain, flashes of light, or floaters OU.          Last edited by Rockie Neighbours, [place] on 02/25/2021  8:15 AM. (History)      Referring physician: Denita Lung, MD [place],  [place] [address]  HISTORICAL INFORMATION:   Selected notes from the MEDICAL RECORD NUMBER    Lab Results  Component Value Date   HGBA1C 5.4 10/14/2018     CURRENT MEDICATIONS: Current Outpatient Medications (Ophthalmic Drugs)  Medication Sig  . Brimonidine Tartrate-Timolol (COMBIGAN OP) Apply to eye.  . latanoprost (XALATAN) 0.005 % ophthalmic solution Place 1 drop into both eyes at bedtime.  . SYSTANE ULTRA 0.4-0.3 % SOLN SMARTSIG:1 Drop(s) In Eye(s) PRN   No current facility-administered medications for this visit. (Ophthalmic Drugs)   No current outpatient medications on file. (Other)   No current facility-administered medications for this visit. (Other)      REVIEW OF SYSTEMS:    ALLERGIES Allergies  Allergen Reactions  . Penicillins   . Sulfacetamide Sodium     PAST MEDICAL HISTORY History reviewed. No pertinent past medical history. Past Surgical History:  Procedure Laterality Date  . CATARACT EXTRACTION Bilateral 2013   Dr. Talbert Forest    FAMILY HISTORY Family History  Problem Relation Age of Onset  . Amblyopia Neg Hx   . Blindness Neg Hx   . Cancer Neg Hx   . Cataracts Neg Hx   . Diabetes Neg Hx   .  Glaucoma Neg Hx   . Hypertension Neg Hx   . Macular degeneration Neg Hx   . Retinal detachment Neg Hx   . Strabismus Neg Hx   . Stroke Neg Hx   . Thyroid disease Neg Hx   . Retinitis pigmentosa Neg Hx     SOCIAL HISTORY Social History   Tobacco Use  . Smoking status: Never Smoker  . Smokeless tobacco: Never Used  Substance Use Topics  . Alcohol use: Yes    Alcohol/week: 1.0 standard drink    Types: 1 Glasses of wine per week  . Drug use: Never         OPHTHALMIC EXAM: Base Eye Exam    Visual Acuity (ETDRS)      Right Left   Dist [place] 20/25 20/40   Dist ph   20/30 +1       Tonometry (Tonopen, 8:19 AM)      Right Left   Pressure 09 12       Pupils      Pupils Dark Light Shape React APD   Right PERRL 2 2 Round Minimal None   Left PERRL 2 2 Round Minimal None       Visual Fields (Counting fingers)      Left Right    Full  Restrictions  Partial outer superior temporal, inferior temporal, superior nasal, inferior nasal deficiencies       Extraocular Movement      Right Left    Full Full       Neuro/Psych    Oriented x3: Yes   Mood/Affect: Normal       Dilation    Both eyes: 1.0% Mydriacyl, 2.5% Phenylephrine @ 8:19 AM        Slit Lamp and Fundus Exam    External Exam      Right Left   External Normal Normal       Slit Lamp Exam      Right Left   Lids/Lashes Normal Normal   Conjunctiva/Sclera White and quiet White and quiet   Cornea Clear Clear   Anterior Chamber Deep and quiet, no cells Deep and quiet, no cells   Iris Round and reactive Round and reactive   Lens Posterior chamber intraocular lens, Open posterior capsule Posterior chamber intraocular lens, 1+ Posterior capsular opacification   Anterior Vitreous Normal,, no cells Normal,,       Fundus Exam      Right Left   Posterior Vitreous Normal Normal, no cells   Disc 2+ Optic disc atrophy, 1+ Pallor Normal   C/D Ratio 0.9 0.3   Macula Soft confluent drusen, Advanced age related  macular degeneration, no exudates, no macular thickening, no hemorrhage Soft confluent drusen, Advanced age related macular degeneration, no exudates, no macular thickening, no hemorrhage   Vessels Normal Normal   Periphery Normal Normal          IMAGING AND PROCEDURES  Imaging and Procedures for 02/25/21  OCT, Retina - OU - Both Eyes       Right Eye Quality was good. Scan locations included subfoveal. Central Foveal Thickness: 388. Progression has been stable. Findings include abnormal foveal contour, retinal drusen , subretinal hyper-reflective material.   Left Eye Quality was good. Scan locations included subfoveal. Central Foveal Thickness: 385. Progression has been stable. Findings include abnormal foveal contour, retinal drusen , subretinal hyper-reflective material.   Notes Bilateral serous retinal detachment with subfoveal location of unchanged now for years.  No signs of active CNVM and preserved good acuity OU  Pseudovitelliform drusenoid type depositions by OCT OU but no signs of intraretinal fluid.  Overall slightly improved OU as compared to last visit with less thickening                ASSESSMENT/PLAN:  Exudative age-related macular degeneration of right eye with inactive choroidal neovascularization (HCC) No signs of recurrence OU.  Sarcoid uveitis of right eye No signs of recurrence OU, quiescent OD  Primary open angle glaucoma of right eye, severe stage Follow-up with Dr. Talbert Forest as scheduled  Adult onset vitelliform macular dystrophy Stable now for many years no signs of CNVM currently      ICD-10-CM   1. Exudative age-related macular degeneration of right eye with active choroidal neovascularization (HCC)  H35.3211 OCT, Retina - OU - Both Eyes  2. Exudative age-related macular degeneration of right eye with inactive choroidal neovascularization ([place])  H35.3212   3. Sarcoid uveitis of right eye  D86.83   4. Primary open angle glaucoma of right  eye, severe stage  H40.1113   5. Adult onset vitelliform macular dystrophy  H35.54     1.  OU with pseudo Vonzella Nipple form large subfoveal drusenoid deposits which have been stable now for many years and in fact today slightly less thickening with  excellent computer-generated database baseline measures as compared to prior visit 3 months previous.  Nonetheless these large supra vitelliform dystrophy lesions are high risk for developing recurrence of CNVM in either eye.  More overI have discussed with the patient that physiologic nightly hypoxic damage from potential sleep central or obstructive sleep apnea can be a contributory cause potentially in a physiologic sense to the function of the RPE in the subfoveal location which may help and assist in clearance of this type of material.  I have some anecdotal cases were occasion that this does help in some folks to diminish the amount of this accumulation of suggest in the past and again to at least monitor nightly oximetry as a personal screening evaluation to determine whether or not other therapy might be appropriate given his medical background.  2.  I shared with the patient a home continuous nightly oximetry monitoring arrangement that would allow him to self address whether or not he has nightly hypoxic events.  I have encouraged him that if he does to consider formal testing with home sleep study.  3.  Ophthalmic Meds Ordered this visit:  No orders of the defined types were placed in this encounter.      Return in about 4 months (around 06/28/2021) for DILATE OU, OCT.  There are no Patient Instructions on file for this visit.   Explained the diagnoses, plan, and follow up with the patient and they expressed understanding.  Patient expressed understanding of the importance of proper follow up care.   Clent Demark  M.D. Diseases & Surgery of the Retina and Vitreous [facility] [place] 02/25/21     Abbreviations: M myopia  (nearsighted); A astigmatism; H hyperopia (farsighted); P presbyopia; Mrx spectacle prescription;  CTL contact lenses; OD right eye; OS left eye; OU both eyes  XT exotropia; ET esotropia; PEK punctate epithelial keratitis; PEE punctate epithelial erosions; DES dry eye syndrome; MGD meibomian gland dysfunction; ATs artificial tears; PFAT's preservative free artificial tears; [place] nuclear sclerotic cataract; PSC posterior subcapsular cataract; ERM epi-retinal membrane; PVD posterior vitreous detachment; RD retinal detachment; DM diabetes mellitus; DR diabetic retinopathy; NPDR non-proliferative diabetic retinopathy; PDR proliferative diabetic retinopathy; CSME clinically significant macular edema; DME diabetic macular edema; dbh dot blot hemorrhages; CWS cotton wool spot; POAG primary open angle glaucoma; C/D cup-to-disc ratio; HVF humphrey visual field; GVF goldmann visual field; OCT optical coherence tomography; IOP intraocular pressure; BRVO Branch retinal vein occlusion; CRVO central retinal vein occlusion; CRAO central retinal artery occlusion; BRAO branch retinal artery occlusion; RT retinal tear; SB scleral buckle; PPV pars plana vitrectomy; VH Vitreous hemorrhage; PRP panretinal laser photocoagulation; IVK intravitreal kenalog; VMT vitreomacular traction; MH Macular hole;  NVD neovascularization of the disc; NVE neovascularization elsewhere; AREDS age related eye disease study; ARMD age related macular degeneration; POAG primary open angle glaucoma; EBMD epithelial/anterior basement membrane dystrophy; ACIOL anterior chamber intraocular lens; IOL intraocular lens; PCIOL posterior chamber intraocular lens; Phaco/IOL phacoemulsification with intraocular lens placement; [place] photorefractive keratectomy; LASIK laser assisted in situ keratomileusis; HTN hypertension; DM diabetes mellitus; COPD chronic obstructive pulmonary disease

## 2021-04-25 ENCOUNTER — Other Ambulatory Visit: Payer: Self-pay

## 2021-04-25 ENCOUNTER — Other Ambulatory Visit: Payer: Medicare Other

## 2021-04-25 DIAGNOSIS — I2510 Atherosclerotic heart disease of native coronary artery without angina pectoris: Secondary | ICD-10-CM

## 2021-04-25 DIAGNOSIS — E559 Vitamin D deficiency, unspecified: Secondary | ICD-10-CM

## 2021-04-25 DIAGNOSIS — D8683 Sarcoid iridocyclitis: Secondary | ICD-10-CM | POA: Diagnosis not present

## 2021-04-26 LAB — LIPID PANEL
Chol/HDL Ratio: 4.5 ratio (ref 0.0–5.0)
Cholesterol, Total: 261 mg/dL — ABNORMAL HIGH (ref 100–199)
HDL: 58 mg/dL (ref >39)
LDL Chol Calc (NIH): 183 mg/dL — ABNORMAL HIGH (ref 0–99)
Triglycerides: 112 mg/dL (ref 0–149)
VLDL Cholesterol Cal: 20 mg/dL (ref 5–40)

## 2021-04-26 LAB — CBC WITH DIFFERENTIAL/PLATELET
Basophils Absolute: 0 10*3/uL (ref 0.0–0.2)
Basos: 1 %
EOS (ABSOLUTE): 0.1 10*3/uL (ref 0.0–0.4)
Eos: 1 %
Hematocrit: 47.4 % (ref 37.5–51.0)
Hemoglobin: 15.7 g/dL (ref 13.0–17.7)
Immature Grans (Abs): 0 10*3/uL (ref 0.0–0.1)
Immature Granulocytes: 0 %
Lymphocytes Absolute: 2.1 10*3/uL (ref 0.7–3.1)
Lymphs: 42 %
MCH: 31.3 pg (ref 26.6–33.0)
MCHC: 33.1 g/dL (ref 31.5–35.7)
MCV: 95 fL (ref 79–97)
Monocytes Absolute: 0.5 10*3/uL (ref 0.1–0.9)
Monocytes: 10 %
Neutrophils Absolute: 2.3 10*3/uL (ref 1.4–7.0)
Neutrophils: 46 %
Platelets: 161 10*3/uL (ref 150–450)
RBC: 5.01 x10E6/uL (ref 4.14–5.80)
RDW: 13.2 % (ref 11.6–15.4)
WBC: 5.1 10*3/uL (ref 3.4–10.8)

## 2021-04-26 LAB — COMPREHENSIVE METABOLIC PANEL
ALT: 17 IU/L (ref 0–44)
AST: 16 IU/L (ref 0–40)
Albumin/Globulin Ratio: 1.8 (ref 1.2–2.2)
Albumin: 3.7 g/dL (ref 3.6–4.6)
Alkaline Phosphatase: 51 IU/L (ref 44–121)
BUN/Creatinine Ratio: 16 (ref 10–24)
BUN: 17 mg/dL (ref 8–27)
Bilirubin Total: 0.4 mg/dL (ref 0.0–1.2)
CO2: 22 mmol/L (ref 20–29)
Calcium: 9 mg/dL (ref 8.6–10.2)
Chloride: 108 mmol/L — ABNORMAL HIGH (ref 96–106)
Creatinine, Ser: 1.08 mg/dL (ref 0.76–1.27)
Globulin, Total: 2.1 g/dL (ref 1.5–4.5)
Glucose: 91 mg/dL (ref 65–99)
Potassium: 4.2 mmol/L (ref 3.5–5.2)
Sodium: 142 mmol/L (ref 134–144)
Total Protein: 5.8 g/dL — ABNORMAL LOW (ref 6.0–8.5)
eGFR: 69 mL/min/{1.73_m2} (ref >59)

## 2021-04-26 LAB — VITAMIN D 25 HYDROXY (VIT D DEFICIENCY, FRACTURES): Vit D, 25-Hydroxy: 61.5 ng/mL (ref 30.0–100.0)

## 2021-04-27 NOTE — Progress Notes (Signed)
Aaron Bullock is a 80 y.o. male who presents for annual wellness visit and follow-up on chronic medical conditions.  He no particular concerns or complaints.  I again discussed immunizations with him and he is not at all interested in that.  Also discussed his history of heart disease and he has not seen a cardiologist he states since he had the CABG grafting and does not intend to do that.  He does have underlying OSA and does use an oral appliance but has not had follow-up on that to see if it has benefit with his OSA.  He is followed by ophthalmology for his glaucoma.  Has had previous history of cataract surgery.  He is retired.  He is married.  He tends to treat his various conditions with alternative measures.   Immunizations and Health Maintenance  There is no immunization history on file for this patient. Health Maintenance Due  Topic Date Due   COVID-19 Vaccine (1) Never done   TETANUS/TDAP  Never done   Zoster Vaccines- Shingrix (1 of 2) Never done   PNA vac Low Risk Adult (1 of 2 - PCV13) Never done    Last colonoscopy: never Last PSA: Dentist: Dr. Caryn Section Ophtho: Dr. [name], Dr. Talbert Forest, Dr. Manuella Ghazi Exercise: weekly walks in the driveway about 1 mile, goes up and down stairs   Other doctors caring for patient include: no other doctor's  Advanced Directives: Does Patient Have a Medical Advance Directive?: Yes Type of Advance Directive: [place] will Does patient want to make changes to medical advance directive?: Yes (ED - Information included in AVS) Copy of [place] in Chart?: Yes - validated most recent copy scanned in chart (See row information)  Depression screen:  See questionnaire below.     Depression screen [facility] 2/9 04/28/2021 10/14/2018  Decreased Interest 0 0  Down, Depressed, Hopeless 0 0  PHQ - 2 Score 0 0    Fall Screen: See Questionaire below.   Fall Risk  04/28/2021  Falls in the past year? 0  Number falls in  past yr: 0  Injury with Fall? 0  Risk for fall due to : No Fall Risks  Follow up [place] evaluation completed    ADL screen:  See questionnaire below.  Functional Status Survey: Is the patient deaf or have difficulty hearing?: Yes (some issues) Does the patient have difficulty seeing, even when wearing glasses/contacts?: Yes Does the patient have difficulty concentrating, remembering, or making decisions?: Yes (short term memory) Does the patient have difficulty walking or climbing stairs?: No Does the patient have difficulty dressing or bathing?: No Does the patient have difficulty doing errands alone such as visiting a doctor's office or shopping?: No   Review of Systems  Constitutional: -, -unexpected weight change, -anorexia, -fatigue Allergy: -sneezing, -itching, -congestion Dermatology: denies changing moles, rash, lumps ENT: -runny nose, -ear pain, -sore throat,  Cardiology:  -chest pain, -palpitations, -orthopnea, Respiratory: -cough, -shortness of breath, -dyspnea on exertion, -wheezing,  Gastroenterology: -abdominal pain, -nausea, -vomiting, -diarrhea, -constipation, -dysphagia Hematology: -bleeding or bruising problems Musculoskeletal: -arthralgias, -myalgias, -joint swelling, -back pain, - Ophthalmology: -vision changes,  Urology: -dysuria, -difficulty urinating,  -urinary frequency, -urgency, incontinence Neurology: -, -numbness, , -memory loss, -falls, -dizziness    PHYSICAL EXAM:  BP 122/80   Pulse 64   Temp 97.8 F (36.6 C)   Ht '5\' 5"'$  (1.651 m)   Wt 164 lb 6.4 oz (74.6 kg)   BMI 27.36 kg/m  General Appearance: Alert, cooperative, no distress, appears stated age Head: Normocephalic, without obvious abnormality, atraumatic Eyes: PERRL, conjunctiva/corneas clear, EOM's intact,  Ears: Normal TM's and external ear canals Nose: Nares normal, mucosa normal, no drainage or sinus   tenderness Throat: Lips, mucosa, and tongue normal; teeth and gums normal Neck:  Supple, no lymphadenopathy, thyroid:no enlargement/tenderness/nodules; no carotid bruit or JVD Lungs: Clear to auscultation bilaterally without wheezes, rales or ronchi; respirations unlabored Heart: Regular rate and rhythm, S1 and S2 normal, no murmur, rub or gallop Neurologic: CNII-XII intact, normal strength, sensation and gait; reflexes 2+ and symmetric throughout   Psych: Normal mood, affect, hygiene and grooming  ASSESSMENT/PLAN: ASHD (arteriosclerotic heart disease)  S/P CABG x 3  OSA (obstructive sleep apnea) - Plan: Home sleep test  Exudative age-related macular degeneration of right eye with inactive choroidal neovascularization (HCC)  Sarcoid uveitis of right eye  Adult onset vitelliform macular dystrophy  Personal history of noncompliance with medical treatment, presenting hazards to health  History of cataract extraction, unspecified laterality Time to order a sleep study to see if the dental appliance is working.  As mentioned above high discussed routine health maintenance including immunizations, none of which she is interested in pursuing.  He also is not interested in pursuing seeing a cardiologist stating he is not having any cardiac related symptoms.  He will follow-up with ophthalmology.    Medicare Attestation I have personally reviewed: The patient's medical and social history Their use of alcohol, tobacco or illicit drugs Their current medications and supplements The patient's functional ability including ADLs,fall risks, home safety risks, cognitive, and hearing and visual impairment Diet and physical activities Evidence for depression or mood disorders  The patient's weight, height, and BMI have been recorded in the chart.  I have made referrals, counseling, and provided education to the patient based on review of the above and I have provided the patient with a written personalized care plan for preventive services.     Jill Alexanders, MD   04/28/2021

## 2021-04-28 ENCOUNTER — Ambulatory Visit (INDEPENDENT_AMBULATORY_CARE_PROVIDER_SITE_OTHER): Payer: Medicare Other | Admitting: Family Medicine

## 2021-04-28 ENCOUNTER — Encounter: Payer: Self-pay | Admitting: Family Medicine

## 2021-04-28 ENCOUNTER — Other Ambulatory Visit: Payer: Self-pay

## 2021-04-28 VITALS — BP 122/80 | HR 64 | Temp 97.8°F | Ht 65.0 in | Wt 164.4 lb

## 2021-04-28 DIAGNOSIS — H3554 Dystrophies primarily involving the retinal pigment epithelium: Secondary | ICD-10-CM

## 2021-04-28 DIAGNOSIS — I2510 Atherosclerotic heart disease of native coronary artery without angina pectoris: Secondary | ICD-10-CM | POA: Diagnosis not present

## 2021-04-28 DIAGNOSIS — Z9849 Cataract extraction status, unspecified eye: Secondary | ICD-10-CM | POA: Diagnosis not present

## 2021-04-28 DIAGNOSIS — Z91199 Patient's noncompliance with other medical treatment and regimen due to unspecified reason: Secondary | ICD-10-CM

## 2021-04-28 DIAGNOSIS — D8683 Sarcoid iridocyclitis: Secondary | ICD-10-CM | POA: Diagnosis not present

## 2021-04-28 DIAGNOSIS — Z9119 Patient's noncompliance with other medical treatment and regimen: Secondary | ICD-10-CM | POA: Diagnosis not present

## 2021-04-28 DIAGNOSIS — Z951 Presence of aortocoronary bypass graft: Secondary | ICD-10-CM

## 2021-04-28 DIAGNOSIS — H353212 Exudative age-related macular degeneration, right eye, with inactive choroidal neovascularization: Secondary | ICD-10-CM

## 2021-04-28 DIAGNOSIS — G4733 Obstructive sleep apnea (adult) (pediatric): Secondary | ICD-10-CM

## 2021-05-16 DIAGNOSIS — Z961 Presence of intraocular lens: Secondary | ICD-10-CM | POA: Diagnosis not present

## 2021-05-16 DIAGNOSIS — H353212 Exudative age-related macular degeneration, right eye, with inactive choroidal neovascularization: Secondary | ICD-10-CM | POA: Diagnosis not present

## 2021-05-16 DIAGNOSIS — H401132 Primary open-angle glaucoma, bilateral, moderate stage: Secondary | ICD-10-CM | POA: Diagnosis not present

## 2021-05-16 DIAGNOSIS — H04122 Dry eye syndrome of left lacrimal gland: Secondary | ICD-10-CM | POA: Diagnosis not present

## 2021-06-23 ENCOUNTER — Encounter (HOSPITAL_BASED_OUTPATIENT_CLINIC_OR_DEPARTMENT_OTHER): Payer: Medicare Other | Admitting: Internal Medicine

## 2021-06-30 ENCOUNTER — Encounter (INDEPENDENT_AMBULATORY_CARE_PROVIDER_SITE_OTHER): Payer: Self-pay | Admitting: Ophthalmology

## 2021-06-30 ENCOUNTER — Ambulatory Visit (INDEPENDENT_AMBULATORY_CARE_PROVIDER_SITE_OTHER): Payer: Medicare Other | Admitting: Ophthalmology

## 2021-06-30 ENCOUNTER — Other Ambulatory Visit: Payer: Self-pay

## 2021-06-30 DIAGNOSIS — H35722 Serous detachment of retinal pigment epithelium, left eye: Secondary | ICD-10-CM

## 2021-06-30 DIAGNOSIS — I2510 Atherosclerotic heart disease of native coronary artery without angina pectoris: Secondary | ICD-10-CM | POA: Diagnosis not present

## 2021-06-30 DIAGNOSIS — H35721 Serous detachment of retinal pigment epithelium, right eye: Secondary | ICD-10-CM | POA: Diagnosis not present

## 2021-06-30 DIAGNOSIS — H3554 Dystrophies primarily involving the retinal pigment epithelium: Secondary | ICD-10-CM | POA: Diagnosis not present

## 2021-06-30 DIAGNOSIS — H353212 Exudative age-related macular degeneration, right eye, with inactive choroidal neovascularization: Secondary | ICD-10-CM | POA: Diagnosis not present

## 2021-06-30 DIAGNOSIS — H353211 Exudative age-related macular degeneration, right eye, with active choroidal neovascularization: Secondary | ICD-10-CM

## 2021-06-30 NOTE — Assessment & Plan Note (Signed)
As component of pseudo vitelliform macular dystrophy, OD stable patient does continue on oral device to maximize oxygenation at night to prevent macular hypoxia from potential obstructive sleep apnea

## 2021-06-30 NOTE — Assessment & Plan Note (Signed)
No signs of recurrence 

## 2021-06-30 NOTE — Assessment & Plan Note (Signed)

## 2021-06-30 NOTE — Progress Notes (Signed)
06/30/2021     CHIEF COMPLAINT Patient presents for  Chief Complaint  Patient presents with   Retina Follow Up      HISTORY OF PRESENT ILLNESS: Aaron Bullock is a 80 y.o. male who presents to the clinic today for:   HPI     Retina Follow Up   Patient presents with  Wet AMD.  In both eyes.  This started 4 months ago.  Duration of 4 months.        Comments   4 month f/u OU with OCT  Pt c/o a gradual decrease in the clarity of vision in both eye, OD>OS. Pt also c/o some difficulty with reading, is requiring more light. Pt denies any new flashes or floaters. Pt denies any eye pain.   Eye Meds: Combigan OP BID OU Latanoprost QHS OU      Last edited by Reather Littler, COA on 06/30/2021  8:10 AM.      Referring physician: Denita Lung, [place],  [place] [id number]  HISTORICAL INFORMATION:   Selected notes from the MEDICAL RECORD NUMBER    Lab Results  Component Value Date   HGBA1C 5.4 10/14/2018     CURRENT MEDICATIONS: Current Outpatient Medications (Ophthalmic Drugs)  Medication Sig   Brimonidine Tartrate-Timolol (COMBIGAN OP) Apply to eye.   latanoprost (XALATAN) 0.005 % ophthalmic solution Place 1 drop into both eyes at bedtime.   SYSTANE ULTRA 0.4-0.3 % SOLN SMARTSIG:1 Drop(s) In Eye(s) PRN   No current facility-administered medications for this visit. (Ophthalmic Drugs)   No current outpatient medications on file. (Other)   No current facility-administered medications for this visit. (Other)      REVIEW OF SYSTEMS:    ALLERGIES Allergies  Allergen Reactions   Penicillins    Sulfacetamide Sodium     PAST MEDICAL HISTORY History reviewed. No pertinent past medical history. Past Surgical History:  Procedure Laterality Date   CATARACT EXTRACTION Bilateral 2013   Dr. Talbert Forest    FAMILY HISTORY Family History  Problem Relation Age of Onset   Amblyopia Neg Hx    Blindness Neg Hx    Cancer Neg Hx    Cataracts  Neg Hx    Diabetes Neg Hx    Glaucoma Neg Hx    Hypertension Neg Hx    Macular degeneration Neg Hx    Retinal detachment Neg Hx    Strabismus Neg Hx    Stroke Neg Hx    Thyroid disease Neg Hx    Retinitis pigmentosa Neg Hx     SOCIAL HISTORY Social History   Tobacco Use   Smoking status: Never   Smokeless tobacco: Never  Substance Use Topics   Alcohol use: Yes    Alcohol/week: 1.0 standard drink    Types: 1 Glasses of wine per week   Drug use: Never         OPHTHALMIC EXAM:  Base Eye Exam     Visual Acuity (ETDRS)       Right Left   Dist [place] 20/30 -2 20/30 -2   Dist ph [place] NI NI         Tonometry (Tonopen, 8:15 AM)       Right Left   Pressure 10 8         Pupils       Dark Light Shape React APD   Right 2 2 Round Minimal None   Left 2 2 Round Minimal None  Visual Fields       Left Right    Full    Restrictions  Partial outer superior temporal, inferior temporal, superior nasal, inferior nasal deficiencies         Extraocular Movement       Right Left    Full, Ortho Full, Ortho         Neuro/Psych     Oriented x3: Yes   Mood/Affect: Normal         Dilation     Both eyes: 1.0% Mydriacyl, 2.5% Phenylephrine @ 8:15 AM           Slit Lamp and Fundus Exam     External Exam       Right Left   External Normal Normal         Slit Lamp Exam       Right Left   Lids/Lashes Normal Normal   Conjunctiva/Sclera White and quiet White and quiet   Cornea Clear Clear   Anterior Chamber Deep and quiet, no cells Deep and quiet, no cells   Iris Round and reactive Round and reactive   Lens Posterior chamber intraocular lens, Open posterior capsule Posterior chamber intraocular lens, 1+ Posterior capsular opacification   Anterior Vitreous Normal,, no cells Normal,,         Fundus Exam       Right Left   Posterior Vitreous Normal Normal, no cells   Disc 2+ Optic disc atrophy, 1+ Pallor Normal   C/D Ratio 0.9 0.3    Macula Soft confluent drusen, Advanced age related macular degeneration, no exudates, no macular thickening, no hemorrhage, no hemorrhage, no fluid visible no exudates, no macular thickening, no hemorrhage   Vessels Normal Normal   Periphery Normal Normal            IMAGING AND PROCEDURES  Imaging and Procedures for 06/30/21  OCT, Retina - OU - Both Eyes       Right Eye Quality was good. Scan locations included subfoveal. Central Foveal Thickness: 368. Progression has been stable. Findings include abnormal foveal contour, retinal drusen , subretinal hyper-reflective material.   Left Eye Quality was good. Scan locations included subfoveal. Central Foveal Thickness: 379. Progression has been stable. Findings include abnormal foveal contour, retinal drusen , subretinal hyper-reflective material.   Notes Bilateral serous retinal detachment with subfoveal location of unchanged now for years.  No signs of active CNVM and preserved good acuity OU  Pseudovitelliform drusenoid type depositions by OCT OU but no signs of intraretinal fluid.  Overall slightly improved OU as compared to last visit with less thickening,              ASSESSMENT/PLAN:  Adult onset vitelliform macular dystrophy The nature of age--related macular degeneration was discussed with the patient as well as the distinction between dry and wet types. Checking an A[place]ler Grid daily with advice to return immediately should a distortion develop, was given to the patient. The patient 's smoking status now and in the past was determined and advice based on the AREDS study was provided regarding the consumption of antioxidant supplements. AREDS 2 vitamin formulation was recommended. Consumption of dark leafy vegetables and fresh fruits of various colors was recommended. Treatment modalities for wet macular degeneration particularly the use of intravitreal injections of anti-blood vessel growth factors was discussed with the  patient. Avastin, Lucentis, and Eylea are the available options. On occasion, therapy includes the use of photodynamic therapy and thermal laser. Stressed to the patient do  not rub eyes.  Patient was advised to check Amsler Grid daily and return immediately if changes are noted. Instructions on using the grid were given to the patient. All patient questions were answered.  Exudative age-related macular degeneration of right eye with inactive choroidal neovascularization (HCC) No signs of recurrence  Serous detachment of retinal pigment epithelium of right eye As component of pseudo vitelliform macular dystrophy, OD stable patient does continue on oral device to maximize oxygenation at night to prevent macular hypoxia from potential obstructive sleep apnea  Serous detachment of retinal pigment epithelium of left eye As component of pseudo vitelliform macular dystrophy, OS stable patient does continue on oral device to maximize oxygenation at night to prevent macular hypoxia from potential obstructive sleep apnea      ICD-10-CM   1. Exudative age-related macular degeneration of right eye with active choroidal neovascularization (HCC)  H35.3211 OCT, Retina - OU - Both Eyes    2. Adult onset vitelliform macular dystrophy  H35.54     3. Exudative age-related macular degeneration of right eye with inactive choroidal neovascularization ([place])  H35.3212     4. Serous detachment of retinal pigment epithelium of right eye  H35.721     5. Serous detachment of retinal pigment epithelium of left eye  H35.722       No signs of progression of serous detachment of the foveal region over the The confluent soft drusen in the macula.  Nonetheless the left eye has slightly improved over time with less serous fluid elevation.    2.  Patient does continue using oral appliance in order to minimize the effects of obstructive sleep apnea due to upper airway obstruction and nighttime potentially triggering  macular hypoxic stress  3.  Ophthalmic Meds Ordered this visit:  No orders of the defined types were placed in this encounter.      No follow-ups on file.  There are no Patient Instructions on file for this visit.   Explained the diagnoses, plan, and follow up with the patient and they expressed understanding.  Patient expressed understanding of the importance of proper follow up care.   Clent Demark  M.D. Diseases & Surgery of the Retina and Vitreous Retina & Diabetic [place] 06/30/21     Abbreviations: M myopia (nearsighted); A astigmatism; H hyperopia (farsighted); P presbyopia; Mrx spectacle prescription;  CTL contact lenses; OD right eye; OS left eye; OU both eyes  XT exotropia; ET esotropia; PEK punctate epithelial keratitis; PEE punctate epithelial erosions; DES dry eye syndrome; MGD meibomian gland dysfunction; ATs artificial tears; PFAT's preservative free artificial tears; [place] nuclear sclerotic cataract; PSC posterior subcapsular cataract; ERM epi-retinal membrane; PVD posterior vitreous detachment; RD retinal detachment; DM diabetes mellitus; DR diabetic retinopathy; NPDR non-proliferative diabetic retinopathy; PDR proliferative diabetic retinopathy; CSME clinically significant macular edema; DME diabetic macular edema; dbh dot blot hemorrhages; CWS cotton wool spot; POAG primary open angle glaucoma; C/D cup-to-disc ratio; HVF humphrey visual field; GVF goldmann visual field; OCT optical coherence tomography; IOP intraocular pressure; BRVO Branch retinal vein occlusion; CRVO central retinal vein occlusion; CRAO central retinal artery occlusion; BRAO branch retinal artery occlusion; RT retinal tear; SB scleral buckle; PPV pars plana vitrectomy; VH Vitreous hemorrhage; PRP panretinal laser photocoagulation; IVK intravitreal kenalog; VMT vitreomacular traction; MH Macular hole;  NVD neovascularization of the disc; NVE neovascularization elsewhere; AREDS age related eye disease  study; ARMD age related macular degeneration; POAG primary open angle glaucoma; EBMD epithelial/anterior basement membrane dystrophy; ACIOL anterior chamber intraocular lens; IOL intraocular  lens; PCIOL posterior chamber intraocular lens; Phaco/IOL phacoemulsification with intraocular lens placement; [place] photorefractive keratectomy; LASIK laser assisted in situ keratomileusis; HTN hypertension; DM diabetes mellitus; COPD chronic obstructive pulmonary disease

## 2021-06-30 NOTE — Assessment & Plan Note (Addendum)
As component of pseudo vitelliform macular dystrophy, OS stable patient does continue on oral device to maximize oxygenation at night to prevent macular hypoxia from potential obstructive sleep apnea

## 2021-08-12 DIAGNOSIS — H353211 Exudative age-related macular degeneration, right eye, with active choroidal neovascularization: Secondary | ICD-10-CM | POA: Diagnosis not present

## 2021-08-12 DIAGNOSIS — Z961 Presence of intraocular lens: Secondary | ICD-10-CM | POA: Diagnosis not present

## 2021-08-12 DIAGNOSIS — H30033 Focal chorioretinal inflammation, peripheral, bilateral: Secondary | ICD-10-CM | POA: Diagnosis not present

## 2021-08-12 DIAGNOSIS — H3581 Retinal edema: Secondary | ICD-10-CM | POA: Diagnosis not present

## 2021-08-12 DIAGNOSIS — Z79899 Other long term (current) drug therapy: Secondary | ICD-10-CM | POA: Diagnosis not present

## 2021-08-12 DIAGNOSIS — H353122 Nonexudative age-related macular degeneration, left eye, intermediate dry stage: Secondary | ICD-10-CM | POA: Diagnosis not present

## 2021-08-19 ENCOUNTER — Encounter (HOSPITAL_BASED_OUTPATIENT_CLINIC_OR_DEPARTMENT_OTHER): Payer: Medicare Other | Admitting: Internal Medicine

## 2021-08-26 ENCOUNTER — Encounter (INDEPENDENT_AMBULATORY_CARE_PROVIDER_SITE_OTHER): Payer: Self-pay

## 2021-09-23 ENCOUNTER — Ambulatory Visit (HOSPITAL_BASED_OUTPATIENT_CLINIC_OR_DEPARTMENT_OTHER): Payer: Medicare Other | Admitting: Internal Medicine

## 2021-09-24 ENCOUNTER — Ambulatory Visit (HOSPITAL_BASED_OUTPATIENT_CLINIC_OR_DEPARTMENT_OTHER): Payer: Medicare Other | Admitting: Internal Medicine

## 2021-09-24 ENCOUNTER — Other Ambulatory Visit: Payer: Self-pay

## 2021-09-24 VITALS — Ht 65.0 in | Wt 160.0 lb

## 2021-09-27 DIAGNOSIS — G4733 Obstructive sleep apnea (adult) (pediatric): Secondary | ICD-10-CM | POA: Diagnosis not present

## 2021-09-27 NOTE — Procedures (Signed)
° ° °  Patient Name: Aaron Bullock, Aaron Bullock Date: 09/24/2021 Gender: Male D.O.B: 1940/12/18 Age (years): 43 Referring Provider: Denita Lung Height (inches): 83 Interpreting Physician: Baird Lyons MD, ABSM Weight (lbs): 160 RPSGT: [place], [place] BMI: 27 MRN: [id number] Neck Size: 15.50  CLINICAL INFORMATION Sleep Study Type: HST Indication for sleep study: Insomnia with OSA Epworth Sleepiness Score: 10  SLEEP STUDY TECHNIQUE A multi-channel overnight portable sleep study was performed. The channels recorded were: nasal airflow, thoracic respiratory movement, and oxygen saturation with a pulse oximetry. Snoring was also monitored.  MEDICATIONS Patient self administered medications include: none reported.  SLEEP ARCHITECTURE Patient was studied for 383.5 minutes. The sleep efficiency was 99.9 % and the patient was supine for 27.8%. The arousal index was 0.0 per hour.  RESPIRATORY PARAMETERS The overall AHI was 10.6 per hour, with a central apnea index of 0 per hour. The oxygen nadir was 85% during sleep.  CARDIAC DATA Mean heart rate during sleep was 67.1 bpm.  IMPRESSIONS - Mild obstructive sleep apnea occurred during this study (AHI = 10.6/h). - Moderate oxygen desaturation was noted during this study (Min O2 = 85%). Mean O2 saturation 94%.  - Patient snored.  DIAGNOSIS - Obstructive Sleep Apnea (G47.33)  RECOMMENDATIONS - Treatment for mild OSA with current oral appliance may be sufficient  CPAP or other options, and treatment for Insomnia may be considered, depending on symptoms.. - Be careful with alcohol, sedatives and other CNS depressants that may worsen sleep apnea and disrupt normal sleep architecture. - Sleep hygiene should be reviewed to assess factors that may improve sleep quality. - Weight management and regular exercise should be initiated or continued.  [Electronically signed] 09/27/2021 12:26 PM  Baird Lyons MD, [place], American Board of  Sleep Medicine   NPI: [id number]                         [place], [place] [facility]  ELECTRONICALLY SIGNED ON:  09/27/2021, 12:19 PM [facility] PH: [phone]   FX: [phone] [place]

## 2021-10-01 ENCOUNTER — Ambulatory Visit (HOSPITAL_BASED_OUTPATIENT_CLINIC_OR_DEPARTMENT_OTHER): Payer: Medicare Other | Admitting: Internal Medicine

## 2021-10-01 NOTE — Progress Notes (Signed)
I discussed the sleep study with him.  He is using an oral appliance and is not interested in pursuing any other options.

## 2021-10-29 DIAGNOSIS — L814 Other melanin hyperpigmentation: Secondary | ICD-10-CM | POA: Diagnosis not present

## 2021-10-29 DIAGNOSIS — L578 Other skin changes due to chronic exposure to nonionizing radiation: Secondary | ICD-10-CM | POA: Diagnosis not present

## 2021-10-29 DIAGNOSIS — L821 Other seborrheic keratosis: Secondary | ICD-10-CM | POA: Diagnosis not present

## 2021-10-29 DIAGNOSIS — Z23 Encounter for immunization: Secondary | ICD-10-CM | POA: Diagnosis not present

## 2021-10-29 DIAGNOSIS — L570 Actinic keratosis: Secondary | ICD-10-CM | POA: Diagnosis not present

## 2021-10-29 DIAGNOSIS — D225 Melanocytic nevi of trunk: Secondary | ICD-10-CM | POA: Diagnosis not present

## 2021-10-29 DIAGNOSIS — Z85828 Personal history of other malignant neoplasm of skin: Secondary | ICD-10-CM | POA: Diagnosis not present

## 2021-11-25 DIAGNOSIS — H04122 Dry eye syndrome of left lacrimal gland: Secondary | ICD-10-CM | POA: Diagnosis not present

## 2021-11-25 DIAGNOSIS — H353212 Exudative age-related macular degeneration, right eye, with inactive choroidal neovascularization: Secondary | ICD-10-CM | POA: Diagnosis not present

## 2021-11-25 DIAGNOSIS — H401132 Primary open-angle glaucoma, bilateral, moderate stage: Secondary | ICD-10-CM | POA: Diagnosis not present

## 2021-11-25 DIAGNOSIS — H353122 Nonexudative age-related macular degeneration, left eye, intermediate dry stage: Secondary | ICD-10-CM | POA: Diagnosis not present

## 2021-12-01 ENCOUNTER — Ambulatory Visit (INDEPENDENT_AMBULATORY_CARE_PROVIDER_SITE_OTHER): Payer: Medicare Other | Admitting: Ophthalmology

## 2021-12-01 ENCOUNTER — Other Ambulatory Visit: Payer: Self-pay

## 2021-12-01 ENCOUNTER — Encounter (INDEPENDENT_AMBULATORY_CARE_PROVIDER_SITE_OTHER): Payer: Self-pay | Admitting: Ophthalmology

## 2021-12-01 DIAGNOSIS — H353212 Exudative age-related macular degeneration, right eye, with inactive choroidal neovascularization: Secondary | ICD-10-CM | POA: Diagnosis not present

## 2021-12-01 DIAGNOSIS — H35722 Serous detachment of retinal pigment epithelium, left eye: Secondary | ICD-10-CM | POA: Diagnosis not present

## 2021-12-01 DIAGNOSIS — H3554 Dystrophies primarily involving the retinal pigment epithelium: Secondary | ICD-10-CM | POA: Diagnosis not present

## 2021-12-01 DIAGNOSIS — H35721 Serous detachment of retinal pigment epithelium, right eye: Secondary | ICD-10-CM | POA: Diagnosis not present

## 2021-12-01 DIAGNOSIS — G4733 Obstructive sleep apnea (adult) (pediatric): Secondary | ICD-10-CM | POA: Diagnosis not present

## 2021-12-01 DIAGNOSIS — H401113 Primary open-angle glaucoma, right eye, severe stage: Secondary | ICD-10-CM

## 2021-12-01 DIAGNOSIS — H353211 Exudative age-related macular degeneration, right eye, with active choroidal neovascularization: Secondary | ICD-10-CM

## 2021-12-01 NOTE — Progress Notes (Signed)
12/01/2021     CHIEF COMPLAINT Patient presents for  Chief Complaint  Patient presents with   Macular Degeneration      HISTORY OF PRESENT ILLNESS: Aaron Bullock is a 81 y.o. male who presents to the clinic today for:   HPI   5 mos fu ou oct. Pt states vision "is not getting any better." Pt denies new FOL or floaters. Pt is using combigan OU BID, Latanoprost QHS OU.  Patient successfully continues on oral EMA device to minimize nightly hypoxic stress and damage from untreated sleep apnea.  No interval change.  Recent intraocular pressures have been in the high single digits with evaluation with Dr. Talbert Forest   Last edited by Hurman Horn, MD on 12/01/2021  9:23 AM.      Referring physician: Darleen Crocker, MD [address] [place],  [place] [address]  HISTORICAL INFORMATION:   Selected notes from the [place]    Lab Results  Component Value Date   HGBA1C 5.4 10/14/2018     CURRENT MEDICATIONS: Current Outpatient Medications (Ophthalmic Drugs)  Medication Sig   Brimonidine Tartrate-Timolol (COMBIGAN OP) Apply to eye.   latanoprost (XALATAN) 0.005 % ophthalmic solution Place 1 drop into both eyes at bedtime.   SYSTANE ULTRA 0.4-0.3 % SOLN SMARTSIG:1 Drop(s) In Eye(s) PRN   No current facility-administered medications for this visit. (Ophthalmic Drugs)   No current outpatient medications on file. (Other)   No current facility-administered medications for this visit. (Other)      REVIEW OF SYSTEMS: ROS   Negative for: Constitutional, Gastrointestinal, Neurological, Skin, Genitourinary, Musculoskeletal, HENT, Endocrine, Cardiovascular, Eyes, Respiratory, Psychiatric, Allergic/Imm, Heme/Lymph Last edited by Hurman Horn, MD on 12/01/2021  9:13 AM.       ALLERGIES Allergies  Allergen Reactions   Penicillins    Sulfacetamide Sodium     PAST MEDICAL HISTORY History reviewed. No pertinent past medical history. Past Surgical  History:  Procedure Laterality Date   CATARACT EXTRACTION Bilateral 2013   Dr. Talbert Forest    FAMILY HISTORY Family History  Problem Relation Age of Onset   Amblyopia Neg Hx    Blindness Neg Hx    Cancer Neg Hx    Cataracts Neg Hx    Diabetes Neg Hx    Glaucoma Neg Hx    Hypertension Neg Hx    Macular degeneration Neg Hx    Retinal detachment Neg Hx    Strabismus Neg Hx    Stroke Neg Hx    Thyroid disease Neg Hx    Retinitis pigmentosa Neg Hx     SOCIAL HISTORY Social History   Tobacco Use   Smoking status: Never   Smokeless tobacco: Never  Substance Use Topics   Alcohol use: Yes    Alcohol/week: 1.0 standard drink    Types: 1 Glasses of wine per week   Drug use: Never         OPHTHALMIC EXAM:  Base Eye Exam     Visual Acuity (ETDRS)       Right Left   Dist [place] 20/25 20/30 -2         Tonometry (Tonopen, 8:23 AM)       Right Left   Pressure 18 13         Pupils       Pupils Dark Light Shape React APD   Right PERRL 2 2  Minimal None   Left PERRL 2 2 Irregular  None  Visual Fields       Left Right    Full Full         Extraocular Movement       Right Left    Full Full         Neuro/Psych     Oriented x3: Yes   Mood/Affect: Normal         Dilation     Both eyes: 1.0% Mydriacyl, 2.5% Phenylephrine @ 8:23 AM           Slit Lamp and Fundus Exam     External Exam       Right Left   External Normal Normal         Slit Lamp Exam       Right Left   Lids/Lashes Normal Normal   Conjunctiva/Sclera White and quiet White and quiet   Cornea Clear Clear   Anterior Chamber Deep and quiet, no cells Deep and quiet, no cells   Iris Round and reactive Round and reactive   Lens Posterior chamber intraocular lens, Open posterior capsule Posterior chamber intraocular lens, 1+ Posterior capsular opacification   Anterior Vitreous Normal,, no cells Normal,,         Fundus Exam       Right Left   Posterior Vitreous  Normal Normal, no cells   Disc 2+ Optic disc atrophy, 1+ Pallor Normal   C/D Ratio 0.9 0.3   Macula Soft confluent drusen, Advanced age related macular degeneration, no exudates, no macular thickening, no hemorrhage, no hemorrhage, no fluid visible, Retinal pigment epithelial mottling no exudates, no macular thickening, no hemorrhage, Retinal pigment epithelial mottling   Vessels Normal Normal   Periphery Normal Normal            IMAGING AND PROCEDURES  Imaging and Procedures for 12/01/21  OCT, Retina - OU - Both Eyes       Right Eye Quality was good. Scan locations included subfoveal. Central Foveal Thickness: 364. Progression has been stable. Findings include abnormal foveal contour, retinal drusen , subretinal hyper-reflective material.   Left Eye Quality was good. Scan locations included subfoveal. Central Foveal Thickness: 374. Progression has been stable. Findings include abnormal foveal contour, retinal drusen , subretinal hyper-reflective material.   Notes Bilateral serous retinal detachment with subfoveal location of unchanged now for years, with chronic serous fluid elevation OD yet now with solidified subretinal hyper reflective material and foveal location which has been present and stable now for over 2 years..  No signs of active CNVM and preserved good acuity OU  Pseudovitelliform drusenoid type depositions by OCT OU but no signs of intraretinal fluid.  Overall slightly improved OU as compared to last visit with less thickening,              ASSESSMENT/PLAN:  Exudative age-related macular degeneration of right eye with inactive choroidal neovascularization (HCC) No signs of reactive CNVM, chronic serous detachment of the RPE and retina OD, stable now for years  Primary open angle glaucoma of right eye, severe stage Under the care of Dr. Audry Pili but also continues on oral EMA nightly device to prevent oral obstruction and potentially maximizing nightly  oxygenation  of the macula  Serous detachment of retinal pigment epithelium of left eye On oral EMA device, less likely to have nightly hypoxia  Serous detachment of retinal pigment epithelium of right eye No signs of wet AMD  Adult onset vitelliform macular dystrophy Patient can continues to have subretinal hyper reflective material in  the fovea of each eye, associated serous retinal detachment the right eye.  He does continue on oral EMA device and recently had sleep study confirming that this device does prevent nightly hypoxic stress to the brain, optic nerve and the maculae  OSA (obstructive sleep apnea) Continues on oral EMA device     ICD-10-CM   1. Exudative age-related macular degeneration of right eye with active choroidal neovascularization (HCC)  H35.3211 OCT, Retina - OU - Both Eyes    2. Exudative age-related macular degeneration of right eye with inactive choroidal neovascularization ([place])  H35.3212     3. Primary open angle glaucoma of right eye, severe stage  H40.1113     4. Serous detachment of retinal pigment epithelium of left eye  H35.722     5. Serous detachment of retinal pigment epithelium of right eye  H35.721     6. Adult onset vitelliform macular dystrophy  H35.54     7. OSA (obstructive sleep apnea)  G47.33       1.  No specific macular therapy required at this time.  Other than protective macular hypoxic changes from untreated sleep apnea patient continues on oral EMA device  2.  No specific macular treatment required  3.  Ophthalmic Meds Ordered this visit:  No orders of the defined types were placed in this encounter.      Return in about 5 months (around 04/30/2022) for DILATE OU, OCT.  There are no Patient Instructions on file for this visit.   Explained the diagnoses, plan, and follow up with the patient and they expressed understanding.  Patient expressed understanding of the importance of proper follow up care.   Clent Demark   M.D. Diseases & Surgery of the Retina and Vitreous Retina & Diabetic [place] 12/01/21     Abbreviations: M myopia (nearsighted); A astigmatism; H hyperopia (farsighted); P presbyopia; Mrx spectacle prescription;  CTL contact lenses; OD right eye; OS left eye; OU both eyes  XT exotropia; ET esotropia; PEK punctate epithelial keratitis; PEE punctate epithelial erosions; DES dry eye syndrome; MGD meibomian gland dysfunction; ATs artificial tears; PFAT's preservative free artificial tears; [place] nuclear sclerotic cataract; PSC posterior subcapsular cataract; ERM epi-retinal membrane; PVD posterior vitreous detachment; RD retinal detachment; DM diabetes mellitus; DR diabetic retinopathy; NPDR non-proliferative diabetic retinopathy; PDR proliferative diabetic retinopathy; CSME clinically significant macular edema; DME diabetic macular edema; dbh dot blot hemorrhages; CWS cotton wool spot; POAG primary open angle glaucoma; C/D cup-to-disc ratio; HVF humphrey visual field; GVF goldmann visual field; OCT optical coherence tomography; IOP intraocular pressure; BRVO Branch retinal vein occlusion; CRVO central retinal vein occlusion; CRAO central retinal artery occlusion; BRAO branch retinal artery occlusion; RT retinal tear; SB scleral buckle; PPV pars plana vitrectomy; VH Vitreous hemorrhage; PRP panretinal laser photocoagulation; IVK intravitreal kenalog; VMT vitreomacular traction; MH Macular hole;  NVD neovascularization of the disc; NVE neovascularization elsewhere; AREDS age related eye disease study; ARMD age related macular degeneration; POAG primary open angle glaucoma; EBMD epithelial/anterior basement membrane dystrophy; ACIOL anterior chamber intraocular lens; IOL intraocular lens; PCIOL posterior chamber intraocular lens; Phaco/IOL phacoemulsification with intraocular lens placement; [place] photorefractive keratectomy; LASIK laser assisted in situ keratomileusis; HTN hypertension; DM diabetes mellitus; COPD  chronic obstructive pulmonary disease

## 2021-12-01 NOTE — Assessment & Plan Note (Signed)
On oral EMA device, less likely to have nightly hypoxia

## 2021-12-01 NOTE — Assessment & Plan Note (Signed)
No signs of reactive CNVM, chronic serous detachment of the RPE and retina OD, stable now for years

## 2021-12-01 NOTE — Assessment & Plan Note (Signed)
Continues on oral EMA device

## 2021-12-01 NOTE — Assessment & Plan Note (Signed)
No signs of wet AMD

## 2021-12-01 NOTE — Assessment & Plan Note (Signed)
Patient can continues to have subretinal hyper reflective material in the fovea of each eye, associated serous retinal detachment the right eye.  He does continue on oral EMA device and recently had sleep study confirming that this device does prevent nightly hypoxic stress to the brain, optic nerve and the maculae

## 2021-12-01 NOTE — Assessment & Plan Note (Signed)
Under the care of Dr. Audry Pili but also continues on oral EMA nightly device to prevent oral obstruction and potentially maximizing nightly oxygenation  of the macula

## 2022-03-31 ENCOUNTER — Telehealth: Payer: Self-pay

## 2022-04-16 DIAGNOSIS — R1032 Left lower quadrant pain: Secondary | ICD-10-CM | POA: Diagnosis not present

## 2022-04-16 DIAGNOSIS — E7800 Pure hypercholesterolemia, unspecified: Secondary | ICD-10-CM | POA: Diagnosis not present

## 2022-04-16 DIAGNOSIS — K4090 Unilateral inguinal hernia, without obstruction or gangrene, not specified as recurrent: Secondary | ICD-10-CM | POA: Diagnosis not present

## 2022-04-16 DIAGNOSIS — G4733 Obstructive sleep apnea (adult) (pediatric): Secondary | ICD-10-CM | POA: Diagnosis not present

## 2022-04-17 ENCOUNTER — Other Ambulatory Visit: Payer: Self-pay | Admitting: General Surgery

## 2022-04-17 DIAGNOSIS — R1032 Left lower quadrant pain: Secondary | ICD-10-CM

## 2022-04-24 ENCOUNTER — Ambulatory Visit
Admission: RE | Admit: 2022-04-24 | Discharge: 2022-04-24 | Disposition: A | Discharge status: Home / Self Care | Payer: Medicare Other | Source: Ambulatory Visit | Attending: General Surgery | Admitting: General Surgery

## 2022-04-24 DIAGNOSIS — K5900 Constipation, unspecified: Secondary | ICD-10-CM | POA: Diagnosis not present

## 2022-04-24 DIAGNOSIS — R1904 Left lower quadrant abdominal swelling, mass and lump: Secondary | ICD-10-CM | POA: Diagnosis not present

## 2022-04-24 DIAGNOSIS — R110 Nausea: Secondary | ICD-10-CM | POA: Diagnosis not present

## 2022-04-24 DIAGNOSIS — K4090 Unilateral inguinal hernia, without obstruction or gangrene, not specified as recurrent: Secondary | ICD-10-CM | POA: Diagnosis not present

## 2022-04-24 DIAGNOSIS — R1032 Left lower quadrant pain: Secondary | ICD-10-CM

## 2022-04-24 MED ORDER — IOPAMIDOL (ISOVUE-300) INJECTION 61%
100.0000 mL | Freq: Once | INTRAVENOUS | Status: AC | PRN
  Administered 2022-04-24: 100 mL via INTRAVENOUS

## 2022-04-30 ENCOUNTER — Ambulatory Visit (INDEPENDENT_AMBULATORY_CARE_PROVIDER_SITE_OTHER): Payer: Medicare Other | Admitting: Ophthalmology

## 2022-04-30 ENCOUNTER — Encounter (INDEPENDENT_AMBULATORY_CARE_PROVIDER_SITE_OTHER): Payer: Self-pay

## 2022-04-30 DIAGNOSIS — H353211 Exudative age-related macular degeneration, right eye, with active choroidal neovascularization: Secondary | ICD-10-CM

## 2022-04-30 DIAGNOSIS — H401113 Primary open-angle glaucoma, right eye, severe stage: Secondary | ICD-10-CM | POA: Diagnosis not present

## 2022-04-30 DIAGNOSIS — G4733 Obstructive sleep apnea (adult) (pediatric): Secondary | ICD-10-CM | POA: Diagnosis not present

## 2022-04-30 DIAGNOSIS — D8683 Sarcoid iridocyclitis: Secondary | ICD-10-CM

## 2022-04-30 DIAGNOSIS — H353212 Exudative age-related macular degeneration, right eye, with inactive choroidal neovascularization: Secondary | ICD-10-CM

## 2022-04-30 DIAGNOSIS — H353122 Nonexudative age-related macular degeneration, left eye, intermediate dry stage: Secondary | ICD-10-CM | POA: Diagnosis not present

## 2022-04-30 DIAGNOSIS — H04122 Dry eye syndrome of left lacrimal gland: Secondary | ICD-10-CM | POA: Diagnosis not present

## 2022-04-30 DIAGNOSIS — H401132 Primary open-angle glaucoma, bilateral, moderate stage: Secondary | ICD-10-CM | POA: Diagnosis not present

## 2022-04-30 MED ORDER — VYZULTA 0.024 % OP SOLN: OPHTHALMIC | 0 refills | Status: AC

## 2022-04-30 NOTE — Assessment & Plan Note (Signed)
IOP out-of-control OD.  Will advise additional medication at this point.  Follow-up with [facility] Dr. Nancy Fetter and/or Dr. Talbert Forest within a week

## 2022-04-30 NOTE — Progress Notes (Signed)
04/30/2022     CHIEF COMPLAINT Patient presents for  Chief Complaint  Patient presents with   Macular Degeneration      HISTORY OF PRESENT ILLNESS: Aaron Bullock is a 81 y.o. male who presents to the clinic today for:   HPI   Vision dimming for the last 6 months seems like he is looking through a haze now in both eyes ,,not any better than before Patient driving himself, does not want to be dilated because it is too dangerous.  Explained the patient that the best examination is dilated so we will not dilate today as he is uncomfortable driving home dilated Last edited by Hurman Horn, MD on 04/30/2022  8:14 AM.      Referring physician: Denita Lung, MD [place],  [place] [id number]  HISTORICAL INFORMATION:   Selected notes from the MEDICAL RECORD NUMBER    Lab Results  Component Value Date   HGBA1C 5.4 10/14/2018     CURRENT MEDICATIONS: Current Outpatient Medications (Ophthalmic Drugs)  Medication Sig   Latanoprostene Bunod (VYZULTA) 0.024 % SOLN One drop to right eye in morning   Brimonidine Tartrate-Timolol (COMBIGAN OP) Apply to eye.   latanoprost (XALATAN) 0.005 % ophthalmic solution Place 1 drop into both eyes at bedtime.   SYSTANE ULTRA 0.4-0.3 % SOLN SMARTSIG:1 Drop(s) In Eye(s) PRN   No current facility-administered medications for this visit. (Ophthalmic Drugs)   No current outpatient medications on file. (Other)   No current facility-administered medications for this visit. (Other)      REVIEW OF SYSTEMS: ROS   Negative for: Constitutional, Gastrointestinal, Neurological, Skin, Genitourinary, Musculoskeletal, HENT, Endocrine, Cardiovascular, Eyes, Respiratory, Psychiatric, Allergic/Imm, Heme/Lymph Last edited by Hurman Horn, MD on 04/30/2022  8:10 AM.       ALLERGIES Allergies  Allergen Reactions   Penicillins    Sulfacetamide Sodium     PAST MEDICAL HISTORY No past medical history on file. Past Surgical  History:  Procedure Laterality Date   CATARACT EXTRACTION Bilateral 2013   Dr. Talbert Forest    FAMILY HISTORY Family History  Problem Relation Age of Onset   Amblyopia Neg Hx    Blindness Neg Hx    Cancer Neg Hx    Cataracts Neg Hx    Diabetes Neg Hx    Glaucoma Neg Hx    Hypertension Neg Hx    Macular degeneration Neg Hx    Retinal detachment Neg Hx    Strabismus Neg Hx    Stroke Neg Hx    Thyroid disease Neg Hx    Retinitis pigmentosa Neg Hx     SOCIAL HISTORY Social History   Tobacco Use   Smoking status: Never   Smokeless tobacco: Never  Substance Use Topics   Alcohol use: Yes    Alcohol/week: 1.0 standard drink of alcohol    Types: 1 Glasses of wine per week   Drug use: Never         OPHTHALMIC EXAM:  Base Eye Exam     Visual Acuity (ETDRS)       Right Left   Dist [place] 20/25 -2 20/30 -2         Tonometry (Tonopen, 8:16 AM)       Right Left   Pressure 30 19         Tonometry #2 (Applanation, 8:19 AM)       Right Left   Pressure 31 20         Pupils  Pupils APD   Right PERRL None   Left PERRL          Visual Fields       Left Right    Full Full         Extraocular Movement       Right Left    Full, Ortho Full, Ortho         Neuro/Psych     Oriented x3: Yes   Mood/Affect: Normal           Slit Lamp and Fundus Exam     External Exam       Right Left   External Normal Normal         Slit Lamp Exam       Right Left   Lids/Lashes Normal Normal   Conjunctiva/Sclera White and quiet White and quiet   Cornea Clear, no kp Clear   Anterior Chamber Deep and quiet, no cells Deep and quiet, no cells   Iris Round and reactive Round and reactive   Lens Posterior chamber intraocular lens, Open posterior capsule Posterior chamber intraocular lens, 1+ Posterior capsular opacification   Anterior Vitreous Normal,, no cells Normal,,            IMAGING AND PROCEDURES  Imaging and Procedures for 04/30/22  OCT,  Retina - OU - Both Eyes       Right Eye Quality was good. Scan locations included subfoveal. Central Foveal Thickness: 390. Progression has been stable. Findings include abnormal foveal contour, retinal drusen , subretinal hyper-reflective material.   Left Eye Quality was good. Scan locations included subfoveal. Central Foveal Thickness: 366. Progression has been stable. Findings include abnormal foveal contour, retinal drusen , subretinal hyper-reflective material.   Notes Bilateral serous retinal detachment with subfoveal location of unchanged now for years, with chronic serous fluid elevation OD yet now with solidified subretinal hyper reflective material and foveal location which has been present and stable now for over 2.5 years..  No signs of active CNVM and preserved good acuity OU  Pseudovitelliform drusenoid type depositions by OCT OU but no signs of intraretinal fluid.  Overall slightly improved OU as compared to last visit with less thickening,               ASSESSMENT/PLAN:  OSA (obstructive sleep apnea) No on cpap  Sarcoid uveitis of right eye No sign of recurrence  Primary open angle glaucoma of right eye, severe stage IOP out-of-control OD.  Will advise additional medication at this point.  Follow-up with [facility] Dr. Nancy Fetter and/or Dr. Talbert Forest within a week     ICD-10-CM   1. Exudative age-related macular degeneration of right eye with active choroidal neovascularization (HCC)  H35.3211 OCT, Retina - OU - Both Eyes    2. OSA (obstructive sleep apnea)  G47.33     3. Sarcoid uveitis of right eye  D86.83     4. Exudative age-related macular degeneration of right eye with inactive choroidal neovascularization (HCC)  H35.3212 OCT, Retina - OU - Both Eyes    5. Primary open angle glaucoma of right eye, severe stage  H40.1113       1.  IOP OD out-of-control.  Needs referral back to [facility] Dr. Nancy Fetter or Dr. Talbert Forest  2.  Signs of iritis worsening  OU.  3.  MD OU stable with stable acuity.  We will continue to monitor  Ophthalmic Meds Ordered this visit:  Meds ordered this encounter  Medications  Latanoprostene Bunod (VYZULTA) 0.024 % SOLN    Sig: One drop to right eye in morning    Dispense:  3 mL    Refill:  0       Return in about 4 months (around 08/31/2022) for DILATE OU if patient has a driver, OCT.  There are no Patient Instructions on file for this visit.   Explained the diagnoses, plan, and follow up with the patient and they expressed understanding.  Patient expressed understanding of the importance of proper follow up care.   Clent Demark  M.D. Diseases & Surgery of the Retina and Vitreous Retina & Diabetic [place] 04/30/22     Abbreviations: M myopia (nearsighted); A astigmatism; H hyperopia (farsighted); P presbyopia; Mrx spectacle prescription;  CTL contact lenses; OD right eye; OS left eye; OU both eyes  XT exotropia; ET esotropia; PEK punctate epithelial keratitis; PEE punctate epithelial erosions; DES dry eye syndrome; MGD meibomian gland dysfunction; ATs artificial tears; PFAT's preservative free artificial tears; [place] nuclear sclerotic cataract; PSC posterior subcapsular cataract; ERM epi-retinal membrane; PVD posterior vitreous detachment; RD retinal detachment; DM diabetes mellitus; DR diabetic retinopathy; NPDR non-proliferative diabetic retinopathy; PDR proliferative diabetic retinopathy; CSME clinically significant macular edema; DME diabetic macular edema; dbh dot blot hemorrhages; CWS cotton wool spot; POAG primary open angle glaucoma; C/D cup-to-disc ratio; HVF humphrey visual field; GVF goldmann visual field; OCT optical coherence tomography; IOP intraocular pressure; BRVO Branch retinal vein occlusion; CRVO central retinal vein occlusion; CRAO central retinal artery occlusion; BRAO branch retinal artery occlusion; RT retinal tear; SB scleral buckle; PPV pars plana vitrectomy; VH Vitreous  hemorrhage; PRP panretinal laser photocoagulation; IVK intravitreal kenalog; VMT vitreomacular traction; MH Macular hole;  NVD neovascularization of the disc; NVE neovascularization elsewhere; AREDS age related eye disease study; ARMD age related macular degeneration; POAG primary open angle glaucoma; EBMD epithelial/anterior basement membrane dystrophy; ACIOL anterior chamber intraocular lens; IOL intraocular lens; PCIOL posterior chamber intraocular lens; Phaco/IOL phacoemulsification with intraocular lens placement; [place] photorefractive keratectomy; LASIK laser assisted in situ keratomileusis; HTN hypertension; DM diabetes mellitus; COPD chronic obstructive pulmonary disease

## 2022-04-30 NOTE — Assessment & Plan Note (Signed)
No sign of recurrence 

## 2022-04-30 NOTE — Assessment & Plan Note (Signed)
No on cpap

## 2022-05-01 ENCOUNTER — Telehealth: Payer: Self-pay

## 2022-05-01 NOTE — Telephone Encounter (Signed)
This nurse attempted to call patient for scheduled AWV. Person that answered the phone said something then hung up on me.

## 2022-05-07 DIAGNOSIS — D414 Neoplasm of uncertain behavior of bladder: Secondary | ICD-10-CM | POA: Diagnosis not present

## 2022-05-08 ENCOUNTER — Encounter (HOSPITAL_BASED_OUTPATIENT_CLINIC_OR_DEPARTMENT_OTHER): Payer: Self-pay | Admitting: Urology

## 2022-05-08 ENCOUNTER — Other Ambulatory Visit: Payer: Self-pay

## 2022-05-08 ENCOUNTER — Telehealth: Payer: Self-pay | Admitting: Family Medicine

## 2022-05-08 ENCOUNTER — Other Ambulatory Visit: Payer: Self-pay | Admitting: Urology

## 2022-05-08 NOTE — Progress Notes (Addendum)
Spoke with dr rose mda and reviewed patient medical history , pt had cabg x 4 1996 and has not followed up with cardiology since 01-16-2013 stress test, per dr Iona Beard rose mda pt needs echo and cardiac clearance prior to 05-13-2022 surgery, left voice mail message for connie at [facility] urology pt needs cardiac clearance with echo done prior to 05-13-2022 surgery per dr rose mda.

## 2022-05-08 NOTE — Telephone Encounter (Signed)
Pt called and states that while at Urology yesterday his bp was 184/75 which is very high for him. Pt wanted to come in to have his bp checked, as Audelia Acton is only provider in today and over booked I discussed situation with him. Audelia Acton states that it has been over a year since pt has been seen and due to bp number he felt that appt with [facility] next week was appropriate.   Audelia Acton also stated that if pt started having any other symptoms he was to go to ER. PT was advised of all info. PT states he is not having any other symptoms. He declined to make an appt. Pt was advised to keep a check on his bp and go to ER if he is concerned or if any other symptoms occur. Pt verbalized understanding.

## 2022-05-13 ENCOUNTER — Encounter (HOSPITAL_BASED_OUTPATIENT_CLINIC_OR_DEPARTMENT_OTHER): Admission: RE | Payer: Self-pay | Source: Home / Self Care

## 2022-05-13 ENCOUNTER — Ambulatory Visit (HOSPITAL_BASED_OUTPATIENT_CLINIC_OR_DEPARTMENT_OTHER): Admission: RE | Admit: 2022-05-13 | Payer: Medicare Other | Source: Home / Self Care | Admitting: Urology

## 2022-05-13 HISTORY — DX: Unspecified macular degeneration: H35.30

## 2022-05-13 HISTORY — DX: Other specified disorders of bladder: N32.89

## 2022-05-13 HISTORY — DX: Unspecified glaucoma: H40.9

## 2022-05-13 HISTORY — DX: Sleep apnea, unspecified: G47.30

## 2022-05-13 HISTORY — DX: Unilateral inguinal hernia, without obstruction or gangrene, not specified as recurrent: K40.90

## 2022-05-13 HISTORY — DX: Atherosclerotic heart disease of native coronary artery without angina pectoris: I25.10

## 2022-05-13 SURGERY — TURBT (TRANSURETHRAL RESECTION OF BLADDER TUMOR)
Anesthesia: General

## 2022-05-29 DIAGNOSIS — H04122 Dry eye syndrome of left lacrimal gland: Secondary | ICD-10-CM | POA: Diagnosis not present

## 2022-05-29 DIAGNOSIS — H353212 Exudative age-related macular degeneration, right eye, with inactive choroidal neovascularization: Secondary | ICD-10-CM | POA: Diagnosis not present

## 2022-05-29 DIAGNOSIS — H401132 Primary open-angle glaucoma, bilateral, moderate stage: Secondary | ICD-10-CM | POA: Diagnosis not present

## 2022-05-29 DIAGNOSIS — H353122 Nonexudative age-related macular degeneration, left eye, intermediate dry stage: Secondary | ICD-10-CM | POA: Diagnosis not present

## 2022-06-01 ENCOUNTER — Encounter: Payer: Self-pay | Admitting: Family Medicine

## 2022-06-01 ENCOUNTER — Ambulatory Visit (INDEPENDENT_AMBULATORY_CARE_PROVIDER_SITE_OTHER): Payer: Medicare Other | Admitting: Family Medicine

## 2022-06-01 VITALS — BP 132/60 | HR 55 | Temp 97.7°F | Wt 156.2 lb

## 2022-06-01 DIAGNOSIS — N329 Bladder disorder, unspecified: Secondary | ICD-10-CM

## 2022-06-01 DIAGNOSIS — I2510 Atherosclerotic heart disease of native coronary artery without angina pectoris: Secondary | ICD-10-CM

## 2022-06-01 DIAGNOSIS — R030 Elevated blood-pressure reading, without diagnosis of hypertension: Secondary | ICD-10-CM

## 2022-06-01 DIAGNOSIS — E785 Hyperlipidemia, unspecified: Secondary | ICD-10-CM | POA: Diagnosis not present

## 2022-06-01 NOTE — Progress Notes (Signed)
   Subjective:    Patient ID: Aaron Bullock, male    DOB: [birth year], 81 y.o.   MRN: [id number]  HPI He recently had difficulty with left lower quadrant pain and was evaluated by general surgery.  Apparently a bladder lesion was identified.  He was sent to urology.  While in the office his blood pressure there had a systolic in the 410 range and surgery was postponed.  He apparently is not interested in having any surgery.  Review of his record also indicates he has an elevated cholesterol and again not interested in taking any medication for this.   Review of Systems     Objective:   Physical Exam Alert and in no distress.  Blood pressure is recorded.       Assessment & Plan:  Elevated blood pressure reading  ASHD (arteriosclerotic heart disease)  Lesion of bladder  Hyperlipidemia, unspecified hyperlipidemia type At this point his blood pressure is adequate for his age and do not feel any intervention is needed.  He is also not interested in pursuing the bladder tumor or even treating his lipids.  At this point we will continue to monitor the situation which is exactly what he wants.

## 2022-06-05 ENCOUNTER — Ambulatory Visit (INDEPENDENT_AMBULATORY_CARE_PROVIDER_SITE_OTHER): Payer: Medicare Other

## 2022-06-05 VITALS — Ht 65.0 in | Wt 152.0 lb

## 2022-06-05 DIAGNOSIS — Z0000 Encounter for general adult medical examination without abnormal findings: Secondary | ICD-10-CM | POA: Diagnosis not present

## 2022-06-05 NOTE — Progress Notes (Signed)
I connected with Aaron Bullock today by telephone and verified that I am speaking with the correct person using two identifiers. Location patient: home Location provider: work Persons participating in the virtual visit: Mindy Behnken, Glenna Durand LPN.   I discussed the limitations, risks, security and privacy concerns of performing an evaluation and management service by telephone and the availability of in person appointments. I also discussed with the patient that there may be a patient responsible charge related to this service. The patient expressed understanding and verbally consented to this telephonic visit.    Interactive audio and video telecommunications were attempted between this provider and patient, however failed, due to patient having technical difficulties OR patient did not have access to video capability.  We continued and completed visit with audio only.     Vital signs may be patient reported or missing.  Subjective:   Aaron Bullock is a 81 y.o. male who presents for Medicare Annual/Subsequent preventive examination.  Review of Systems     Cardiac Risk Factors include: advanced age (>44mn, >>47women);male gender     Objective:    Today's Vitals   06/05/22 1426  Weight: 152 lb (68.9 kg)  Height: '5\' 5"'$  (1.651 m)   Body mass index is 25.29 kg/m.     06/05/2022    2:29 PM 04/28/2021    2:09 PM  Advanced Directives  Does Patient Have a Medical Advance Directive? Yes Yes  Type of [place]of [place] will [place] HomeLiving will  Does patient want to make changes to medical advance directive?  Yes (ED - Information included in AVS)  Copy of HCrystalin Chart? Yes - validated most recent copy scanned in chart (See row information) Yes - validated most recent copy scanned in chart (See row information)    Current Medications (verified) Outpatient Encounter Medications as of 06/05/2022   Medication Sig   Brimonidine Tartrate-Timolol (COMBIGAN OP) Apply to eye. 1 drop both eyes bid   Latanoprostene Bunod (VYZULTA) 0.024 % SOLN One drop to right eye in morning (Patient taking differently: One drop to each eye at qhs)   Multiple Vitamins-Minerals (VITAMINS/MINERALS PO) Vitamins/Minerals   SYSTANE ULTRA 0.4-0.3 % SOLN SMARTSIG:1 Drop(s) In Eye(s) PRN   No facility-administered encounter medications on file as of 06/05/2022.    Allergies (verified) Penicillins and Sulfacetamide sodium   History: Past Medical History:  Diagnosis Date   Bladder mass    Coronary artery disease    Glaucoma    both eyes   Left inguinal hernia    Macular degeneration    both eyes   Sleep apnea    mild uses oral appliance   Past Surgical History:  Procedure Laterality Date   cagb x 4  11/1994   CATARACT EXTRACTION Bilateral 2013   Dr. BTalbert Forest  HEMORRHOID SURGERY  1995   TONSILLECTOMY     age 81  Family History  Problem Relation Age of Onset   Amblyopia Neg Hx    Blindness Neg Hx    Cancer Neg Hx    Cataracts Neg Hx    Diabetes Neg Hx    Glaucoma Neg Hx    Hypertension Neg Hx    Macular degeneration Neg Hx    Retinal detachment Neg Hx    Strabismus Neg Hx    Stroke Neg Hx    Thyroid disease Neg Hx    Retinitis pigmentosa Neg Hx    Social History  Socioeconomic History   Marital status: Married    Spouse name: Not on file   Number of children: Not on file   Years of education: Not on file   Highest education level: Not on file  Occupational History   Not on file  Tobacco Use   Smoking status: Never   Smokeless tobacco: Never  Vaping Use   Vaping Use: Never used  Substance and Sexual Activity   Alcohol use: Yes    Alcohol/week: 1.0 standard drink of alcohol    Types: 1 Glasses of wine per week    Comment: 1 glass wine per day   Drug use: Never   Sexual activity: Not Currently  Other Topics Concern   Not on file  Social History Narrative   Not on file    Social Determinants of Health   Financial Resource Strain: Low Risk  (06/05/2022)   Overall Financial Resource Strain (CARDIA)    Difficulty of Paying Living Expenses: Not hard at all  Food Insecurity: No Food Insecurity (06/05/2022)   Hunger Vital Sign    Worried About Running Out of Food in the Last Year: Never true    Ran Out of Food in the Last Year: Never true  Transportation Needs: No Transportation Needs (06/05/2022)   PRAPARE - Hydrologist (Medical): No    Lack of Transportation (Non-Medical): No  Physical Activity: Inactive (06/05/2022)   Exercise Vital Sign    Days of Exercise per Week: 0 days    Minutes of Exercise per Session: 0 min  Stress: No Stress Concern Present (06/05/2022)   [place]    Feeling of Stress : Not at all  Social Connections: Not on file    Tobacco Counseling Counseling given: Not Answered   Clinical Intake:  Pre-visit preparation completed: Yes  Pain : No/denies pain     Nutritional Status: BMI 25 -29 Overweight Nutritional Risks: None Diabetes: No  How often do you need to have someone help you when you read instructions, pamphlets, or other written materials from your doctor or pharmacy?: 1 - Never  Diabetic? no  Interpreter Needed?: No  Information entered by :: NAllen LPN   Activities of Daily Living    06/05/2022    2:31 PM  In your present state of health, do you have any difficulty performing the following activities:  Hearing? 0  Vision? 1  Difficulty concentrating or making decisions? 0  Walking or climbing stairs? 0  Dressing or bathing? 0  Doing errands, shopping? 0  Preparing Food and eating ? N  Using the Toilet? N  In the past six months, have you accidently leaked urine? N  Do you have problems with loss of bowel control? N  Managing your Medications? N  Managing your Finances? N  Housekeeping or managing your  Housekeeping? N    Patient Care Team: Denita Lung, MD as PCP - General (Family Medicine)  Indicate any recent Medical Services you may have received from other than [facility] providers in the past year (date may be approximate).     Assessment:   This is a routine wellness examination for Aaron Bullock.  Hearing/Vision screen Vision Screening - Comments:: Regular eye exams, Dr. [name], Dr. Talbert Forest, Dr. Manuella Ghazi  Dietary issues and exercise activities discussed: Current Exercise Habits: The patient does not participate in regular exercise at present   Goals Addressed  This Visit's Progress    Patient Stated       06/05/2022, wants to restrict carbs       Depression Screen    06/05/2022    2:30 PM 06/01/2022    8:21 AM 04/28/2021    2:05 PM 10/14/2018   10:06 AM  PHQ 2/9 Scores  PHQ - 2 Score 0 0 0 0  PHQ- 9 Score 2       Fall Risk    06/05/2022    2:30 PM 06/01/2022    8:21 AM 04/28/2021    2:05 PM  Fall Risk   Falls in the past year? 1 1 0  Comment missed a step    Number falls in past yr: 0 0 0  Injury with Fall? 1 1 0  Comment sprained finger    Risk for fall due to : Medication side effect Impaired mobility No Fall Risks  Follow up Falls evaluation completed;Education provided;Falls prevention discussed Falls evaluation completed Falls evaluation completed    FALL RISK PREVENTION PERTAINING TO THE HOME:  Any stairs in or around the home? Yes  If so, are there any without handrails? No  Home free of loose throw rugs in walkways, pet beds, electrical cords, etc? Yes  Adequate lighting in your home to reduce risk of falls? Yes   ASSISTIVE DEVICES UTILIZED TO PREVENT FALLS:  Life alert? No  Use of a cane, walker or w/c? No  Grab bars in the bathroom? No  Shower chair or bench in shower? Yes  Elevated toilet seat or a handicapped toilet? No   TIMED UP AND GO:  Was the test performed? No .      Cognitive Function:        06/05/2022    2:32 PM  6CIT  Screen  What Year? 0 points  What month? 0 points  What time? 0 points  Count back from 20 0 points  Months in reverse 0 points  Repeat phrase 0 points  Total Score 0 points    Immunizations  There is no immunization history on file for this patient.  TDAP status: Due, Education has been provided regarding the importance of this vaccine. Advised may receive this vaccine at local pharmacy or Health Dept. Aware to provide a copy of the vaccination record if obtained from local pharmacy or Health Dept. Verbalized acceptance and understanding.  Flu Vaccine status: Declined, Education has been provided regarding the importance of this vaccine but patient still declined. Advised may receive this vaccine at local pharmacy or Health Dept. Aware to provide a copy of the vaccination record if obtained from local pharmacy or Health Dept. Verbalized acceptance and understanding.  Pneumococcal vaccine status: Declined,  Education has been provided regarding the importance of this vaccine but patient still declined. Advised may receive this vaccine at local pharmacy or Health Dept. Aware to provide a copy of the vaccination record if obtained from local pharmacy or Health Dept. Verbalized acceptance and understanding.   Covid-19 vaccine status: Declined, Education has been provided regarding the importance of this vaccine but patient still declined. Advised may receive this vaccine at local pharmacy or Health Dept.or vaccine clinic. Aware to provide a copy of the vaccination record if obtained from local pharmacy or Health Dept. Verbalized acceptance and understanding.  Qualifies for Shingles Vaccine? Yes   Zostavax completed No   Shingrix Completed?: No.    Education has been provided regarding the importance of this vaccine. Patient has been advised to call  insurance company to determine out of pocket expense if they have not yet received this vaccine. Advised may also receive vaccine at local pharmacy  or Health Dept. Verbalized acceptance and understanding.  Screening Tests Health Maintenance  Topic Date Due   INFLUENZA VACCINE  01/03/2023 (Originally 05/05/2022)   Pneumonia Vaccine 23+ Years old (1 - PCV) 06/02/2023 (Originally 11/16/2003)   TETANUS/TDAP  06/02/2023 (Originally 11/15/1959)   COVID-19 Vaccine (1) 06/18/2023 (Originally 11/16/1945)   Zoster Vaccines- Shingrix (1 of 2) 09/02/2023 (Originally 11/15/1959)   HPV VACCINES  Aged Out    Health Maintenance  There are no preventive care reminders to display for this patient.  Colorectal cancer screening: No longer required.   Lung Cancer Screening: (Low Dose CT Chest recommended if Age 32-80 years, 30 pack-year currently smoking OR have quit w/in 15years.) does not qualify.   Lung Cancer Screening Referral: no  Additional Screening:  Hepatitis C Screening: does not qualify;  Vision Screening: Recommended annual ophthalmology exams for early detection of glaucoma and other disorders of the eye. Is the patient up to date with their annual eye exam?  Yes  Who is the provider or what is the name of the office in which the patient attends annual eye exams? Dr. Shane Crutch, Dr. Talbert Forest, Dr. Manuella Ghazi If pt is not established with a provider, would they like to be referred to a provider to establish care? No .   Dental Screening: Recommended annual dental exams for proper oral hygiene  Community Resource Referral / Chronic Care Management: CRR required this visit?  No   CCM required this visit?  No      Plan:     I have personally reviewed and noted the following in the patient's chart:   Medical and social history Use of alcohol, tobacco or illicit drugs  Current medications and supplements including opioid prescriptions. Patient is not currently taking opioid prescriptions. Functional ability and status Nutritional status Physical activity Advanced directives List of other physicians Hospitalizations, surgeries, and ER visits  in previous 12 months Vitals Screenings to include cognitive, depression, and falls Referrals and appointments  In addition, I have reviewed and discussed with patient certain preventive protocols, quality metrics, and best practice recommendations. A written personalized care plan for preventive services as well as general preventive health recommendations were provided to patient.     Kellie Simmering, LPN   10/05/5724   Nurse Notes: Patient states that he had a CT scan that showed a lesion on his bladder. He has decided not to go along with plan to treat at this time. He said that he is going to handle it his way.

## 2022-06-05 NOTE — Patient Instructions (Signed)
Aaron Bullock , Thank you for taking time to come for your Medicare Wellness Visit. I appreciate your ongoing commitment to your health goals. Please review the following plan we discussed and let me know if I can assist you in the future.   Screening recommendations/referrals: Colonoscopy: not required Recommended yearly ophthalmology/optometry visit for glaucoma screening and checkup Recommended yearly dental visit for hygiene and checkup  Vaccinations: Influenza vaccine: decline Pneumococcal vaccine: decline Tdap vaccine: decline Shingles vaccine: decline   Covid-19: decline  Advanced directives:  copy in chart  Conditions/risks identified: decided not to go along with urologist plan to treat lesion on bladder at this time  Next appointment: Follow up in one year for your annual wellness visit.   Preventive Care 33 Years and Older, Male Preventive care refers to lifestyle choices and visits with your health care provider that can promote health and wellness. What does preventive care include? A yearly physical exam. This is also called an annual well check. Dental exams once or twice a year. Routine eye exams. Ask your health care provider how often you should have your eyes checked. Personal lifestyle choices, including: Daily care of your teeth and gums. Regular physical activity. Eating a healthy diet. Avoiding tobacco and drug use. Limiting alcohol use. Practicing safe sex. Taking low doses of aspirin every day. Taking vitamin and mineral supplements as recommended by your health care provider. What happens during an annual well check? The services and screenings done by your health care provider during your annual well check will depend on your age, overall health, lifestyle risk factors, and family history of disease. Counseling  Your health care provider may ask you questions about your: Alcohol use. Tobacco use. Drug use. Emotional well-being. Home and relationship  well-being. Sexual activity. Eating habits. History of falls. Memory and ability to understand (cognition). Work and work Statistician. Screening  You may have the following tests or measurements: Height, weight, and BMI. Blood pressure. Lipid and cholesterol levels. These may be checked every 5 years, or more frequently if you are over 38 years old. Skin check. Lung cancer screening. You may have this screening every year starting at age 66 if you have a 30-pack-year history of smoking and currently smoke or have quit within the past 15 years. Fecal occult blood test (FOBT) of the stool. You may have this test every year starting at age 24. Flexible sigmoidoscopy or colonoscopy. You may have a sigmoidoscopy every 5 years or a colonoscopy every 10 years starting at age 82. Prostate cancer screening. Recommendations will vary depending on your family history and other risks. Hepatitis C blood test. Hepatitis B blood test. Sexually transmitted disease (STD) testing. Diabetes screening. This is done by checking your blood sugar (glucose) after you have not eaten for a while (fasting). You may have this done every 1-3 years. Abdominal aortic aneurysm (AAA) screening. You may need this if you are a current or former smoker. Osteoporosis. You may be screened starting at age 63 if you are at high risk. Talk with your health care provider about your test results, treatment options, and if necessary, the need for more tests. Vaccines  Your health care provider may recommend certain vaccines, such as: Influenza vaccine. This is recommended every year. Tetanus, diphtheria, and acellular pertussis (Tdap, Td) vaccine. You may need a Td booster every 10 years. Zoster vaccine. You may need this after age 22. Pneumococcal 13-valent conjugate (PCV13) vaccine. One dose is recommended after age 62. Pneumococcal polysaccharide (PPSV23) vaccine.  One dose is recommended after age 77. Talk to your health care  provider about which screenings and vaccines you need and how often you need them. This information is not intended to replace advice given to you by your health care provider. Make sure you discuss any questions you have with your health care provider. Document Released: 10/18/2015 Document Revised: 06/10/2016 Document Reviewed: 07/23/2015 Elsevier Interactive Patient Education  2017 [place] Prevention in the Home Falls can cause injuries. They can happen to people of all ages. There are many things you can do to make your home safe and to help prevent falls. What can I do on the outside of my home? Regularly fix the edges of walkways and driveways and fix any cracks. Remove anything that might make you trip as you walk through a door, such as a raised step or threshold. Trim any bushes or trees on the path to your home. Use bright outdoor lighting. Clear any walking paths of anything that might make someone trip, such as rocks or tools. Regularly check to see if handrails are loose or broken. Make sure that both sides of any steps have handrails. Any raised decks and porches should have guardrails on the edges. Have any leaves, snow, or ice cleared regularly. Use sand or salt on walking paths during winter. Clean up any spills in your garage right away. This includes oil or grease spills. What can I do in the bathroom? Use night lights. Install grab bars by the toilet and in the tub and shower. Do not use towel bars as grab bars. Use non-skid mats or decals in the tub or shower. If you need to sit down in the shower, use a plastic, non-slip stool. Keep the floor dry. Clean up any water that spills on the floor as soon as it happens. Remove soap buildup in the tub or shower regularly. Attach bath mats securely with double-sided non-slip rug tape. Do not have throw rugs and other things on the floor that can make you trip. What can I do in the bedroom? Use night lights. Make  sure that you have a light by your bed that is easy to reach. Do not use any sheets or blankets that are too big for your bed. They should not hang down onto the floor. Have a firm chair that has side arms. You can use this for support while you get dressed. Do not have throw rugs and other things on the floor that can make you trip. What can I do in the kitchen? Clean up any spills right away. Avoid walking on wet floors. Keep items that you use a lot in easy-to-reach places. If you need to reach something above you, use a strong step stool that has a grab bar. Keep electrical cords out of the way. Do not use floor polish or wax that makes floors slippery. If you must use wax, use non-skid floor wax. Do not have throw rugs and other things on the floor that can make you trip. What can I do with my stairs? Do not leave any items on the stairs. Make sure that there are handrails on both sides of the stairs and use them. Fix handrails that are broken or loose. Make sure that handrails are as long as the stairways. Check any carpeting to make sure that it is firmly attached to the stairs. Fix any carpet that is loose or worn. Avoid having throw rugs at the top or bottom of  the stairs. If you do have throw rugs, attach them to the floor with carpet tape. Make sure that you have a light switch at the top of the stairs and the bottom of the stairs. If you do not have them, ask someone to add them for you. What else can I do to help prevent falls? Wear shoes that: Do not have high heels. Have rubber bottoms. Are comfortable and fit you well. Are closed at the toe. Do not wear sandals. If you use a stepladder: Make sure that it is fully opened. Do not climb a closed stepladder. Make sure that both sides of the stepladder are locked into place. Ask someone to hold it for you, if possible. Clearly mark and make sure that you can see: Any grab bars or handrails. First and last steps. Where the  edge of each step is. Use tools that help you move around (mobility aids) if they are needed. These include: Canes. Walkers. Scooters. Crutches. Turn on the lights when you go into a dark area. Replace any light bulbs as soon as they burn out. Set up your furniture so you have a clear path. Avoid moving your furniture around. If any of your floors are uneven, fix them. If there are any pets around you, be aware of where they are. Review your medicines with your doctor. Some medicines can make you feel dizzy. This can increase your chance of falling. Ask your doctor what other things that you can do to help prevent falls. This information is not intended to replace advice given to you by your health care provider. Make sure you discuss any questions you have with your health care provider. Document Released: 07/18/2009 Document Revised: 02/27/2016 Document Reviewed: 10/26/2014 Elsevier Interactive Patient Education  2017 [employer].

## 2022-07-03 DIAGNOSIS — H353122 Nonexudative age-related macular degeneration, left eye, intermediate dry stage: Secondary | ICD-10-CM | POA: Diagnosis not present

## 2022-07-03 DIAGNOSIS — Z79899 Other long term (current) drug therapy: Secondary | ICD-10-CM | POA: Diagnosis not present

## 2022-07-03 DIAGNOSIS — H353211 Exudative age-related macular degeneration, right eye, with active choroidal neovascularization: Secondary | ICD-10-CM | POA: Diagnosis not present

## 2022-07-03 DIAGNOSIS — H3581 Retinal edema: Secondary | ICD-10-CM | POA: Diagnosis not present

## 2022-07-03 DIAGNOSIS — H30033 Focal chorioretinal inflammation, peripheral, bilateral: Secondary | ICD-10-CM | POA: Diagnosis not present

## 2022-07-03 DIAGNOSIS — Z961 Presence of intraocular lens: Secondary | ICD-10-CM | POA: Diagnosis not present

## 2022-07-08 ENCOUNTER — Telehealth: Payer: Self-pay | Admitting: Licensed Clinical Social Worker

## 2022-07-08 NOTE — Patient Outreach (Signed)
  Care Coordination   Initial Visit Note   07/08/2022 Name: Aaron Bullock MRN: [id number] DOB: 1941-03-04  Aaron Bullock is a 81 y.o. year old male who sees Aaron Lung, MD for primary care. I spoke with  Aaron Bullock by phone today.  What matters to the patients health and wellness today?   Pt's spouse intentionally "hung up" on LCSW during explanation of benefits   Goals Addressed             This Visit's Progress    COMPLETED: Care Coordination Activities-No Follow Up Required       Care Coordination Interventions: Active listening / Reflection utilized  LCSW attempted to inform patient's spouse of care coordination services. Spouse was disconnected the phone call. LCSW dialed the number again and obtained confirmation that spouse "hung up" on LCSW intentionally         SDOH assessments and interventions completed:  No     Care Coordination Interventions Activated:  Yes  Care Coordination Interventions:  No, not indicated   Follow up plan: No further intervention required.   Encounter Outcome:  Pt. Refused   Aaron Bullock, MSW, [email or web address] Phone [phone] 5:37 PM

## 2022-07-08 NOTE — Patient Instructions (Signed)
Visit Information  Thank you for taking time to visit with me today. Please don't hesitate to contact me if I can be of assistance to you.   Following are the goals we discussed today:   Goals Addressed             This Visit's Progress    COMPLETED: Care Coordination Activities-No Follow Up Required       Care Coordination Interventions: Active listening / Reflection utilized  LCSW attempted to inform patient's spouse of care coordination services. Spouse was disconnected the phone call. LCSW dialed the number again and obtained confirmation that spouse "hung up" on LCSW intentionally        If you are experiencing a Mental Health or [place] or need someone to talk to, please call the Suicide and Crisis Lifeline: 988 call 911   The patient verbalized understanding of instructions, educational materials, and care plan provided today and DECLINED offer to receive copy of patient instructions, educational materials, and care plan.   No further follow up required:

## 2022-09-01 ENCOUNTER — Encounter (INDEPENDENT_AMBULATORY_CARE_PROVIDER_SITE_OTHER): Payer: Medicare Other | Admitting: Ophthalmology

## 2022-09-01 DIAGNOSIS — D869 Sarcoidosis, unspecified: Secondary | ICD-10-CM | POA: Diagnosis not present

## 2022-09-01 DIAGNOSIS — H353211 Exudative age-related macular degeneration, right eye, with active choroidal neovascularization: Secondary | ICD-10-CM | POA: Diagnosis not present

## 2022-09-01 DIAGNOSIS — H401113 Primary open-angle glaucoma, right eye, severe stage: Secondary | ICD-10-CM | POA: Diagnosis not present

## 2022-09-01 DIAGNOSIS — H353212 Exudative age-related macular degeneration, right eye, with inactive choroidal neovascularization: Secondary | ICD-10-CM | POA: Diagnosis not present

## 2022-09-01 DIAGNOSIS — G4733 Obstructive sleep apnea (adult) (pediatric): Secondary | ICD-10-CM | POA: Diagnosis not present

## 2022-09-01 DIAGNOSIS — H35721 Serous detachment of retinal pigment epithelium, right eye: Secondary | ICD-10-CM | POA: Diagnosis not present

## 2022-09-22 DIAGNOSIS — G4733 Obstructive sleep apnea (adult) (pediatric): Secondary | ICD-10-CM | POA: Diagnosis not present

## 2022-09-22 DIAGNOSIS — H353212 Exudative age-related macular degeneration, right eye, with inactive choroidal neovascularization: Secondary | ICD-10-CM | POA: Diagnosis not present

## 2022-09-22 DIAGNOSIS — H35721 Serous detachment of retinal pigment epithelium, right eye: Secondary | ICD-10-CM | POA: Diagnosis not present

## 2022-09-22 DIAGNOSIS — H353211 Exudative age-related macular degeneration, right eye, with active choroidal neovascularization: Secondary | ICD-10-CM | POA: Diagnosis not present

## 2022-09-22 DIAGNOSIS — H353122 Nonexudative age-related macular degeneration, left eye, intermediate dry stage: Secondary | ICD-10-CM | POA: Diagnosis not present

## 2022-09-22 DIAGNOSIS — H401113 Primary open-angle glaucoma, right eye, severe stage: Secondary | ICD-10-CM | POA: Diagnosis not present

## 2022-09-22 DIAGNOSIS — H35033 Hypertensive retinopathy, bilateral: Secondary | ICD-10-CM | POA: Diagnosis not present

## 2022-09-29 DIAGNOSIS — H35033 Hypertensive retinopathy, bilateral: Secondary | ICD-10-CM | POA: Diagnosis not present

## 2022-09-29 DIAGNOSIS — H35022 Exudative retinopathy, left eye: Secondary | ICD-10-CM | POA: Diagnosis not present

## 2022-09-29 DIAGNOSIS — H353122 Nonexudative age-related macular degeneration, left eye, intermediate dry stage: Secondary | ICD-10-CM | POA: Diagnosis not present

## 2022-11-04 DIAGNOSIS — L814 Other melanin hyperpigmentation: Secondary | ICD-10-CM | POA: Diagnosis not present

## 2022-11-04 DIAGNOSIS — D225 Melanocytic nevi of trunk: Secondary | ICD-10-CM | POA: Diagnosis not present

## 2022-11-04 DIAGNOSIS — Z85828 Personal history of other malignant neoplasm of skin: Secondary | ICD-10-CM | POA: Diagnosis not present

## 2022-11-04 DIAGNOSIS — L570 Actinic keratosis: Secondary | ICD-10-CM | POA: Diagnosis not present

## 2022-11-04 DIAGNOSIS — L578 Other skin changes due to chronic exposure to nonionizing radiation: Secondary | ICD-10-CM | POA: Diagnosis not present

## 2022-11-04 DIAGNOSIS — L821 Other seborrheic keratosis: Secondary | ICD-10-CM | POA: Diagnosis not present

## 2022-11-30 DIAGNOSIS — H35721 Serous detachment of retinal pigment epithelium, right eye: Secondary | ICD-10-CM | POA: Diagnosis not present

## 2022-11-30 DIAGNOSIS — H35033 Hypertensive retinopathy, bilateral: Secondary | ICD-10-CM | POA: Diagnosis not present

## 2022-11-30 DIAGNOSIS — H35022 Exudative retinopathy, left eye: Secondary | ICD-10-CM | POA: Diagnosis not present

## 2022-11-30 DIAGNOSIS — H401113 Primary open-angle glaucoma, right eye, severe stage: Secondary | ICD-10-CM | POA: Diagnosis not present

## 2022-11-30 DIAGNOSIS — H353122 Nonexudative age-related macular degeneration, left eye, intermediate dry stage: Secondary | ICD-10-CM | POA: Diagnosis not present

## 2022-11-30 DIAGNOSIS — H353212 Exudative age-related macular degeneration, right eye, with inactive choroidal neovascularization: Secondary | ICD-10-CM | POA: Diagnosis not present

## 2022-12-01 DIAGNOSIS — H35033 Hypertensive retinopathy, bilateral: Secondary | ICD-10-CM | POA: Diagnosis not present

## 2022-12-01 DIAGNOSIS — H401113 Primary open-angle glaucoma, right eye, severe stage: Secondary | ICD-10-CM | POA: Diagnosis not present

## 2022-12-01 DIAGNOSIS — H353211 Exudative age-related macular degeneration, right eye, with active choroidal neovascularization: Secondary | ICD-10-CM | POA: Diagnosis not present

## 2022-12-01 DIAGNOSIS — H35721 Serous detachment of retinal pigment epithelium, right eye: Secondary | ICD-10-CM | POA: Diagnosis not present

## 2022-12-01 DIAGNOSIS — H4050X3 Glaucoma secondary to other eye disorders, unspecified eye, severe stage: Secondary | ICD-10-CM | POA: Diagnosis not present

## 2022-12-03 DIAGNOSIS — H353122 Nonexudative age-related macular degeneration, left eye, intermediate dry stage: Secondary | ICD-10-CM | POA: Diagnosis not present

## 2022-12-03 DIAGNOSIS — H353212 Exudative age-related macular degeneration, right eye, with inactive choroidal neovascularization: Secondary | ICD-10-CM | POA: Diagnosis not present

## 2022-12-03 DIAGNOSIS — H401132 Primary open-angle glaucoma, bilateral, moderate stage: Secondary | ICD-10-CM | POA: Diagnosis not present

## 2022-12-03 DIAGNOSIS — H43812 Vitreous degeneration, left eye: Secondary | ICD-10-CM | POA: Diagnosis not present

## 2022-12-07 DIAGNOSIS — D414 Neoplasm of uncertain behavior of bladder: Secondary | ICD-10-CM | POA: Diagnosis not present

## 2022-12-09 DIAGNOSIS — D414 Neoplasm of uncertain behavior of bladder: Secondary | ICD-10-CM | POA: Diagnosis not present

## 2022-12-09 DIAGNOSIS — N3289 Other specified disorders of bladder: Secondary | ICD-10-CM | POA: Diagnosis not present

## 2022-12-15 DIAGNOSIS — H353221 Exudative age-related macular degeneration, left eye, with active choroidal neovascularization: Secondary | ICD-10-CM | POA: Diagnosis not present

## 2022-12-15 DIAGNOSIS — H35022 Exudative retinopathy, left eye: Secondary | ICD-10-CM | POA: Diagnosis not present

## 2022-12-15 DIAGNOSIS — H353211 Exudative age-related macular degeneration, right eye, with active choroidal neovascularization: Secondary | ICD-10-CM | POA: Diagnosis not present

## 2022-12-15 DIAGNOSIS — H401113 Primary open-angle glaucoma, right eye, severe stage: Secondary | ICD-10-CM | POA: Diagnosis not present

## 2022-12-15 DIAGNOSIS — H35033 Hypertensive retinopathy, bilateral: Secondary | ICD-10-CM | POA: Diagnosis not present

## 2022-12-15 DIAGNOSIS — H35721 Serous detachment of retinal pigment epithelium, right eye: Secondary | ICD-10-CM | POA: Diagnosis not present

## 2022-12-24 DIAGNOSIS — D414 Neoplasm of uncertain behavior of bladder: Secondary | ICD-10-CM | POA: Diagnosis not present

## 2023-01-19 DIAGNOSIS — H353221 Exudative age-related macular degeneration, left eye, with active choroidal neovascularization: Secondary | ICD-10-CM | POA: Diagnosis not present

## 2023-01-19 DIAGNOSIS — H353122 Nonexudative age-related macular degeneration, left eye, intermediate dry stage: Secondary | ICD-10-CM | POA: Diagnosis not present

## 2023-01-19 DIAGNOSIS — H35721 Serous detachment of retinal pigment epithelium, right eye: Secondary | ICD-10-CM | POA: Diagnosis not present

## 2023-01-19 DIAGNOSIS — H401113 Primary open-angle glaucoma, right eye, severe stage: Secondary | ICD-10-CM | POA: Diagnosis not present

## 2023-01-19 DIAGNOSIS — H353211 Exudative age-related macular degeneration, right eye, with active choroidal neovascularization: Secondary | ICD-10-CM | POA: Diagnosis not present

## 2023-01-19 DIAGNOSIS — H353212 Exudative age-related macular degeneration, right eye, with inactive choroidal neovascularization: Secondary | ICD-10-CM | POA: Diagnosis not present

## 2023-01-19 NOTE — Progress Notes (Signed)
Cardiology Office Note:    Date:  01/21/2023   ID:  Aaron Bullock, Aaron Bullock 1941-08-19, MRN [id number]  PCP:  Ronnald Nian, MD   [facility] Providers Cardiologist:  None     Referring MD: Ronnald Nian, MD   Chief Complaint  Patient presents with   Coronary Artery Disease   Pre-op Exam    History of Present Illness:    Aaron Bullock is a 82 y.o. male is seen at the request of Dr Cristal Deer Liliane Shi for pre op evaluation for removal of a bladder mass. He is a retired Education officer, community.  Patient has a history of CAD and is s/p CABG in 1996 by Dr Tyrone Sage. Reports CABG x 4. No old records available.  He had a Myoview study in April 2014 that was normal. He is retired for 6 years. Has never taken lipid lowering therapy. No history of HTN, DM or tobacco use. States he is able to walk in his driveway for a mile without difficulty. No prior MI. Was found to have a bladder tumor on CT. Denies any chest pain, dyspnea, palpitations, edema.   Past Medical History:  Diagnosis Date   Bladder mass    Coronary artery disease    Glaucoma    both eyes   Left inguinal hernia    Macular degeneration    both eyes   Sleep apnea    mild uses oral appliance    Past Surgical History:  Procedure Laterality Date   cagb x 4  11/1994   CATARACT EXTRACTION Bilateral 2013   Dr. Vonna Kotyk   HEMORRHOID SURGERY  1995   TONSILLECTOMY     age 23    Current Medications: Current Meds  Medication Sig   Brimonidine Tartrate-Timolol (COMBIGAN OP) Apply to eye. 1 drop both eyes bid   Latanoprostene Bunod (VYZULTA) 0.024 % SOLN One drop to right eye in morning   Multiple Vitamins-Minerals (VITAMINS/MINERALS PO) Vitamins/Minerals   SYSTANE ULTRA 0.4-0.3 % SOLN SMARTSIG:1 Drop(s) In Eye(s) PRN     Allergies:   Penicillins and Sulfacetamide sodium   Social History   Socioeconomic History   Marital status: Married    Spouse name: Not on file   Number of children: Not on file   Years of education: Not  on file   Highest education level: Not on file  Occupational History   Not on file  Tobacco Use   Smoking status: Never   Smokeless tobacco: Never  Vaping Use   Vaping Use: Never used  Substance and Sexual Activity   Alcohol use: Yes    Alcohol/week: 1.0 standard drink of alcohol    Types: 1 Glasses of wine per week    Comment: 1 glass wine per day   Drug use: Never   Sexual activity: Not Currently  Other Topics Concern   Not on file  Social History Narrative   Not on file   Social Determinants of Health   Financial Resource Strain: Low Risk  (06/05/2022)   Overall Financial Resource Strain (CARDIA)    Difficulty of Paying Living Expenses: Not hard at all  Food Insecurity: No Food Insecurity (06/05/2022)   Hunger Vital Sign    Worried About Running Out of Food in the Last Year: Never true    Ran Out of Food in the Last Year: Never true  Transportation Needs: No Transportation Needs (06/05/2022)   PRAPARE - Transportation    Lack of Transportation (Medical): No    Lack of  Transportation (Non-Medical): No  Physical Activity: Inactive (06/05/2022)   Exercise Vital Sign    Days of Exercise per Week: 0 days    Minutes of Exercise per Session: 0 min  Stress: No Stress Concern Present (06/05/2022)   [employer] of Occupational Health - Occupational Stress Questionnaire    Feeling of Stress : Not at all  Social Connections: Not on file     Family History: The patient's family history is negative for Amblyopia, Blindness, Cancer, Cataracts, Diabetes, Glaucoma, Hypertension, Macular degeneration, Retinal detachment, Strabismus, Stroke, Thyroid disease, and Retinitis pigmentosa.  ROS:   Please see the history of present illness.     All other systems reviewed and are negative.  EKGs/Labs/Other Studies Reviewed:    The following studies were reviewed today: See HPI  EKG:  EKG is  ordered today.  The ekg ordered today demonstrates NSR with PACs. Otherwise normal. I have  personally reviewed and interpreted this study.   Recent Labs: No results found for requested labs within last 365 days.  Recent Lipid Panel    Component Value Date/Time   CHOL 261 (H) 04/25/2021 0858   TRIG 112 04/25/2021 0858   HDL 58 04/25/2021 0858   CHOLHDL 4.5 04/25/2021 0858   LDLCALC 183 (H) 04/25/2021 0858     Risk Assessment/Calculations:                Physical Exam:    VS:  BP 138/80   Pulse 68   Ht 5\' 5"  (1.651 m)   Wt 148 lb 3.2 oz (67.2 kg)   SpO2 99%   BMI 24.66 kg/m     Wt Readings from Last 3 Encounters:  01/21/23 148 lb 3.2 oz (67.2 kg)  06/05/22 152 lb (68.9 kg)  06/01/22 156 lb 3.2 oz (70.9 kg)     GEN:  Well nourished, elderly in no acute distress HEENT: Normal, has xanthelasma.  NECK: No JVD; No carotid bruits LYMPHATICS: No lymphadenopathy CARDIAC: RRR, no murmurs, rubs, gallops RESPIRATORY:  Clear to auscultation without rales, wheezing or rhonchi  ABDOMEN: Soft, non-tender, non-distended MUSCULOSKELETAL:  No edema; No deformity  SKIN: Warm and dry NEUROLOGIC:  Alert and oriented x 3 PSYCHIATRIC:  Normal affect   ASSESSMENT:    1. Coronary artery disease involving coronary bypass graft of native heart without angina pectoris   2. S/P CABG x 3   3. Bladder mass   4. Hypercholesteremia    PLAN:    In order of problems listed above:  CAD s/p remote CABG in 1996. Last Myoview in 2014 was normal. He has no angina. Able to function at 7 met level. No active cardiac symptoms, CHF, arrhythmia. Plans for removal of bladder tumor that is relatively low risk. He is cleared to proceed from a cardiac standpoint without further testing.  Hypercholesterolemia. Has deferred therapy in past. Bladder tumor.            Medication Adjustments/Labs and Tests Ordered: Current medicines are reviewed at length with the patient today.  Concerns regarding medicines are outlined above.  No orders of the defined types were placed in this  encounter.  No orders of the defined types were placed in this encounter.   Patient Instructions  Medication Instructions:  Continue same medications   Lab Work: None ordered   Testing/Procedures: None ordered   Follow-Up: At [facility], you and your health needs are our priority.  As part of our continuing mission to provide you with exceptional heart care,  we have created designated Provider Care Teams.  These Care Teams include your primary Cardiologist (physician) and Advanced Practice Providers (APPs -  Physician Assistants and Nurse Practitioners) who all work together to provide you with the care you need, when you need it.  We recommend signing up for the patient portal called "MyChart".  Sign up information is provided on this After Visit Summary.  MyChart is used to connect with patients for Virtual Visits (Telemedicine).  Patients are able to view lab/test results, encounter notes, upcoming appointments, etc.  Non-urgent messages can be sent to your provider as well.   To learn more about what you can do with MyChart, go to [email or web address].    Your next appointment:  As Needed    Provider:  Dr.      Signed,  [place], MD  01/21/2023 1:34 PM    [facility]

## 2023-01-20 DIAGNOSIS — H35029 Exudative retinopathy, unspecified eye: Secondary | ICD-10-CM | POA: Insufficient documentation

## 2023-01-20 DIAGNOSIS — H35022 Exudative retinopathy, left eye: Secondary | ICD-10-CM

## 2023-01-21 ENCOUNTER — Encounter: Payer: Self-pay | Admitting: Cardiology

## 2023-01-21 ENCOUNTER — Ambulatory Visit: Payer: Medicare Other | Attending: Cardiology | Admitting: Cardiology

## 2023-01-21 VITALS — BP 138/80 | HR 68 | Ht 65.0 in | Wt 148.2 lb

## 2023-01-21 DIAGNOSIS — N3289 Other specified disorders of bladder: Secondary | ICD-10-CM | POA: Diagnosis not present

## 2023-01-21 DIAGNOSIS — Z951 Presence of aortocoronary bypass graft: Secondary | ICD-10-CM

## 2023-01-21 DIAGNOSIS — E7800 Pure hypercholesterolemia, unspecified: Secondary | ICD-10-CM | POA: Diagnosis not present

## 2023-01-21 DIAGNOSIS — I25810 Atherosclerosis of coronary artery bypass graft(s) without angina pectoris: Secondary | ICD-10-CM | POA: Diagnosis not present

## 2023-01-21 NOTE — Patient Instructions (Signed)
Medication Instructions:  Continue same medications  Lab Work: None ordered   Testing/Procedures: None ordered   Follow-Up: At [facility], you and your health needs are our priority.  As part of our continuing mission to provide you with exceptional heart care, we have created designated Provider Care Teams.  These Care Teams include your primary Cardiologist (physician) and Advanced Practice Providers (APPs -  Physician Assistants and Nurse Practitioners) who all work together to provide you with the care you need, when you need it.  We recommend signing up for the patient portal called "MyChart".  Sign up information is provided on this After Visit Summary.  MyChart is used to connect with patients for Virtual Visits (Telemedicine).  Patients are able to view lab/test results, encounter notes, upcoming appointments, etc.  Non-urgent messages can be sent to your provider as well.   To learn more about what you can do with MyChart, go to [email or web address].    Your next appointment:  As Needed    Provider: Dr.Jordan   

## 2023-02-18 DIAGNOSIS — H401113 Primary open-angle glaucoma, right eye, severe stage: Secondary | ICD-10-CM | POA: Diagnosis not present

## 2023-02-18 DIAGNOSIS — H353211 Exudative age-related macular degeneration, right eye, with active choroidal neovascularization: Secondary | ICD-10-CM | POA: Diagnosis not present

## 2023-02-18 DIAGNOSIS — H353212 Exudative age-related macular degeneration, right eye, with inactive choroidal neovascularization: Secondary | ICD-10-CM | POA: Diagnosis not present

## 2023-02-18 DIAGNOSIS — H353221 Exudative age-related macular degeneration, left eye, with active choroidal neovascularization: Secondary | ICD-10-CM | POA: Diagnosis not present

## 2023-02-18 DIAGNOSIS — H353122 Nonexudative age-related macular degeneration, left eye, intermediate dry stage: Secondary | ICD-10-CM | POA: Diagnosis not present

## 2023-02-18 DIAGNOSIS — H35721 Serous detachment of retinal pigment epithelium, right eye: Secondary | ICD-10-CM | POA: Diagnosis not present

## 2023-03-04 DIAGNOSIS — H401113 Primary open-angle glaucoma, right eye, severe stage: Secondary | ICD-10-CM | POA: Diagnosis not present

## 2023-03-04 DIAGNOSIS — H353122 Nonexudative age-related macular degeneration, left eye, intermediate dry stage: Secondary | ICD-10-CM | POA: Diagnosis not present

## 2023-03-04 DIAGNOSIS — H35033 Hypertensive retinopathy, bilateral: Secondary | ICD-10-CM | POA: Diagnosis not present

## 2023-03-04 DIAGNOSIS — H4050X3 Glaucoma secondary to other eye disorders, unspecified eye, severe stage: Secondary | ICD-10-CM | POA: Diagnosis not present

## 2023-03-04 DIAGNOSIS — H353211 Exudative age-related macular degeneration, right eye, with active choroidal neovascularization: Secondary | ICD-10-CM | POA: Diagnosis not present

## 2023-03-04 DIAGNOSIS — H353221 Exudative age-related macular degeneration, left eye, with active choroidal neovascularization: Secondary | ICD-10-CM | POA: Diagnosis not present

## 2023-03-04 DIAGNOSIS — H35022 Exudative retinopathy, left eye: Secondary | ICD-10-CM | POA: Diagnosis not present

## 2023-03-04 DIAGNOSIS — H35721 Serous detachment of retinal pigment epithelium, right eye: Secondary | ICD-10-CM | POA: Diagnosis not present

## 2023-03-25 DIAGNOSIS — H353211 Exudative age-related macular degeneration, right eye, with active choroidal neovascularization: Secondary | ICD-10-CM | POA: Diagnosis not present

## 2023-03-25 DIAGNOSIS — H35721 Serous detachment of retinal pigment epithelium, right eye: Secondary | ICD-10-CM | POA: Diagnosis not present

## 2023-03-25 DIAGNOSIS — H401113 Primary open-angle glaucoma, right eye, severe stage: Secondary | ICD-10-CM | POA: Diagnosis not present

## 2023-03-25 DIAGNOSIS — H353122 Nonexudative age-related macular degeneration, left eye, intermediate dry stage: Secondary | ICD-10-CM | POA: Diagnosis not present

## 2023-03-25 DIAGNOSIS — H353221 Exudative age-related macular degeneration, left eye, with active choroidal neovascularization: Secondary | ICD-10-CM | POA: Diagnosis not present

## 2023-03-25 DIAGNOSIS — H35033 Hypertensive retinopathy, bilateral: Secondary | ICD-10-CM | POA: Diagnosis not present

## 2023-03-25 DIAGNOSIS — H35022 Exudative retinopathy, left eye: Secondary | ICD-10-CM | POA: Diagnosis not present

## 2023-04-13 DIAGNOSIS — H353122 Nonexudative age-related macular degeneration, left eye, intermediate dry stage: Secondary | ICD-10-CM | POA: Diagnosis not present

## 2023-04-13 DIAGNOSIS — H401113 Primary open-angle glaucoma, right eye, severe stage: Secondary | ICD-10-CM | POA: Diagnosis not present

## 2023-04-13 DIAGNOSIS — H353211 Exudative age-related macular degeneration, right eye, with active choroidal neovascularization: Secondary | ICD-10-CM | POA: Diagnosis not present

## 2023-04-13 DIAGNOSIS — H35721 Serous detachment of retinal pigment epithelium, right eye: Secondary | ICD-10-CM | POA: Diagnosis not present

## 2023-04-13 DIAGNOSIS — H35033 Hypertensive retinopathy, bilateral: Secondary | ICD-10-CM | POA: Diagnosis not present

## 2023-04-13 DIAGNOSIS — H353221 Exudative age-related macular degeneration, left eye, with active choroidal neovascularization: Secondary | ICD-10-CM | POA: Diagnosis not present

## 2023-04-13 DIAGNOSIS — H35022 Exudative retinopathy, left eye: Secondary | ICD-10-CM | POA: Diagnosis not present

## 2023-04-13 DIAGNOSIS — H4050X3 Glaucoma secondary to other eye disorders, unspecified eye, severe stage: Secondary | ICD-10-CM | POA: Diagnosis not present

## 2023-04-29 ENCOUNTER — Other Ambulatory Visit: Payer: Self-pay | Admitting: Urology

## 2023-04-29 DIAGNOSIS — D414 Neoplasm of uncertain behavior of bladder: Secondary | ICD-10-CM | POA: Diagnosis not present

## 2023-05-03 NOTE — Patient Instructions (Signed)
SURGICAL WAITING ROOM VISITATION  Patients having surgery or a procedure may have no more than 2 support people in the waiting area - these visitors may rotate.    Children under the age of 71 must have an adult with them who is not the patient.  Due to an increase in RSV and influenza rates and associated hospitalizations, children ages 51 and under may not visit patients in [facility] hospitals.  If the patient needs to stay at the hospital during part of their recovery, the visitor guidelines for inpatient rooms apply. Pre-op nurse will coordinate an appropriate time for 1 support person to accompany patient in pre-op.  This support person may not rotate.    Please refer to the [facility] website for the visitor guidelines for Inpatients (after your surgery is over and you are in a regular room).    Your procedure is scheduled on: 05/14/23   Report to [facility] Main Entrance    Report to admitting at 10:15 AM   Call this number if you have problems the morning of surgery [phone]   Do not eat food or drink liquids :After Midnight.          If you have questions, please contact your surgeon's office.   FOLLOW BOWEL PREP AND ANY ADDITIONAL PRE OP INSTRUCTIONS YOU RECEIVED FROM YOUR SURGEON'S OFFICE!!!     Oral Hygiene is also important to reduce your risk of infection.                                    Remember - BRUSH YOUR TEETH THE MORNING OF SURGERY WITH YOUR REGULAR TOOTHPASTE  DENTURES WILL BE REMOVED PRIOR TO SURGERY PLEASE DO NOT APPLY "Poly grip" OR ADHESIVES!!!   Stop all vitamins and herbal supplements 7 days before surgery.   Take these medicines the morning of surgery with A SIP OF WATER: Eye drops             You may not have any metal on your body including jewelry, and body piercing             Do not wear lotions, powders, cologne, or deodorant              Men may shave face and neck.   Do not bring valuables to the hospital. [facility] IS  NOT             RESPONSIBLE   FOR VALUABLES.   Contacts, glasses, dentures or bridgework may not be worn into surgery.  DO NOT BRING YOUR HOME MEDICATIONS TO THE HOSPITAL. PHARMACY WILL DISPENSE MEDICATIONS LISTED ON YOUR MEDICATION LIST TO YOU DURING YOUR ADMISSION IN THE HOSPITAL!    Patients discharged on the day of surgery will not be allowed to drive home.  Someone NEEDS to stay with you for the first 24 hours after anesthesia.              Please read over the following fact sheets you were given: IF YOU HAVE QUESTIONS ABOUT YOUR PRE-OP INSTRUCTIONS PLEASE CALL [phone] [place]ras   If you received a COVID test during your pre-op visit  it is requested that you wear a mask when out in public, stay away from anyone that may not be feeling well and notify your surgeon if you develop symptoms. If you test positive for Covid or have been in contact with anyone that has  tested positive in the last 10 days please notify you surgeon.    [facility] - Preparing for Surgery Before surgery, you can play an important role.  Because skin is not sterile, your skin needs to be as free of germs as possible.  You can reduce the number of germs on your skin by washing with CHG (chlorahexidine gluconate) soap before surgery.  CHG is an antiseptic cleaner which kills germs and bonds with the skin to continue killing germs even after washing. Please DO NOT use if you have an allergy to CHG or antibacterial soaps.  If your skin becomes reddened/irritated stop using the CHG and inform your nurse when you arrive at Short Stay. Do not shave (including legs and underarms) for at least 48 hours prior to the first CHG shower.  You may shave your face/neck.  Please follow these instructions carefully:  1.  Shower with CHG Soap the night before surgery and the  morning of surgery.  2.  If you choose to wash your hair, wash your hair first as usual with your normal  shampoo.  3.  After you shampoo, rinse your hair  and body thoroughly to remove the shampoo.                             4.  Use CHG as you would any other liquid soap.  You can apply chg directly to the skin and wash.  Gently with a scrungie or clean washcloth.  5.  Apply the CHG Soap to your body ONLY FROM THE NECK DOWN.   Do   not use on face/ open                           Wound or open sores. Avoid contact with eyes, ears mouth and   genitals (private parts).                       Wash face,  Genitals (private parts) with your normal soap.             6.  Wash thoroughly, paying special attention to the area where your    surgery  will be performed.  7.  Thoroughly rinse your body with warm water from the neck down.  8.  DO NOT shower/wash with your normal soap after using and rinsing off the CHG Soap.                9.  Pat yourself dry with a clean towel.            10.  Wear clean pajamas.            11.  Place clean sheets on your bed the night of your first shower and do not  sleep with pets. Day of Surgery : Do not apply any lotions/deodorants the morning of surgery.  Please wear clean clothes to the hospital/surgery center.  FAILURE TO FOLLOW THESE INSTRUCTIONS MAY RESULT IN THE CANCELLATION OF YOUR SURGERY  PATIENT SIGNATURE_________________________________  NURSE SIGNATURE__________________________________  ________________________________________________________________________

## 2023-05-03 NOTE — Progress Notes (Signed)
COVID Vaccine Completed:  Date of COVID positive in last 90 days:  PCP - Sharlot Gowda, MD Cardiologist - Peter Swaziland, MD LOV 01/21/23   Cardiac clearance by Peter Swaziland 01/21/23 in Epic  Chest x-ray -  EKG - 01/21/23 Epic Stress Test - 01/26/13 Epic ECHO - 01/26/13 Epic Cardiac Cath -  Pacemaker/ICD device last checked: Spinal Cord Stimulator:  Bowel Prep -   Sleep Study -  CPAP -   Fasting Blood Sugar -  Checks Blood Sugar _____ times a day  Last dose of GLP1 agonist-  N/A GLP1 instructions:  N/A   Last dose of SGLT-2 inhibitors-  N/A SGLT-2 instructions: N/A   Blood Thinner Instructions:  Time Aspirin Instructions: Last Dose:  Activity level:  Can go up a flight of stairs and perform activities of daily living without stopping and without symptoms of chest pain or shortness of breath.  Able to exercise without symptoms  Unable to go up a flight of stairs without symptoms of     Anesthesia review: ASHD, OSA, CABG x3  Patient denies shortness of breath, fever, cough and chest pain at PAT appointment  Patient verbalized understanding of instructions that were given to them at the PAT appointment. Patient was also instructed that they will need to review over the PAT instructions again at home before surgery.

## 2023-05-04 ENCOUNTER — Other Ambulatory Visit: Payer: Self-pay

## 2023-05-04 ENCOUNTER — Encounter (HOSPITAL_COMMUNITY): Payer: Self-pay

## 2023-05-04 ENCOUNTER — Encounter (HOSPITAL_COMMUNITY)
Admission: RE | Admit: 2023-05-04 | Discharge: 2023-05-04 | Disposition: A | Discharge status: Home / Self Care | Payer: Medicare Other | Source: Ambulatory Visit | Attending: Urology | Admitting: Urology

## 2023-05-04 VITALS — BP 157/70 | HR 58 | Temp 98.5°F | Resp 18 | Ht 64.0 in | Wt 143.0 lb

## 2023-05-04 DIAGNOSIS — I2510 Atherosclerotic heart disease of native coronary artery without angina pectoris: Secondary | ICD-10-CM | POA: Diagnosis not present

## 2023-05-04 DIAGNOSIS — Z951 Presence of aortocoronary bypass graft: Secondary | ICD-10-CM | POA: Insufficient documentation

## 2023-05-04 DIAGNOSIS — G4730 Sleep apnea, unspecified: Secondary | ICD-10-CM | POA: Diagnosis not present

## 2023-05-04 DIAGNOSIS — D494 Neoplasm of unspecified behavior of bladder: Secondary | ICD-10-CM | POA: Diagnosis not present

## 2023-05-04 DIAGNOSIS — Z01812 Encounter for preprocedural laboratory examination: Secondary | ICD-10-CM | POA: Diagnosis not present

## 2023-05-04 HISTORY — DX: Pneumonia, unspecified organism: J18.9

## 2023-05-04 HISTORY — DX: Malignant (primary) neoplasm, unspecified: C80.1

## 2023-05-04 LAB — BASIC METABOLIC PANEL
Anion gap: 8 (ref 5–15)
BUN: 21 mg/dL (ref 8–23)
CO2: 25 mmol/L (ref 22–32)
Calcium: 8.9 mg/dL (ref 8.9–10.3)
Chloride: 104 mmol/L (ref 98–111)
Creatinine, Ser: 0.97 mg/dL (ref 0.61–1.24)
GFR, Estimated: >60 mL/min (ref >60)
Glucose, Bld: 108 mg/dL — ABNORMAL HIGH (ref 70–99)
Potassium: 3.8 mmol/L (ref 3.5–5.1)
Sodium: 137 mmol/L (ref 135–145)

## 2023-05-04 LAB — CBC
HCT: 40.7 % (ref 39.0–52.0)
Hemoglobin: 13.5 g/dL (ref 13.0–17.0)
MCH: 31.5 pg (ref 26.0–34.0)
MCHC: 33.2 g/dL (ref 30.0–36.0)
MCV: 95.1 fL (ref 80.0–100.0)
Platelets: 155 10*3/uL (ref 150–400)
RBC: 4.28 MIL/uL (ref 4.22–5.81)
RDW: 13.3 % (ref 11.5–15.5)
WBC: 4.3 10*3/uL (ref 4.0–10.5)
nRBC: 0 % (ref 0.0–0.2)

## 2023-05-04 LAB — NO BLOOD PRODUCTS

## 2023-05-05 NOTE — Progress Notes (Signed)
Anesthesia Chart Review   Case: [id number] Date/Time: 05/14/23 1215   Procedures:      CYSTOSCOPY - 45 MINUTES     TRANSURETHRAL RESECTION OF BLADDER TUMOR (TURBT) with GEMCITABINE INSTILLATION   Anesthesia type: General   Pre-op diagnosis: BLADDER TUMOR   Location: [facility] PROCEDURE ROOM / [facility]   Surgeons: Rene Paci, MD       DISCUSSION:82 y.o. never smoker with h/o sleep apnea, CAD (CABG 1996), bladder tumor scheduled for above procedure 05/14/2023 with Dr. Cristal Deer Liliane Shi.   Pt last seen by cardiology 01/21/2023. Per OV note, "CAD s/p remote CABG in 1996. Last Myoview in 2014 was normal. He has no angina. Able to function at 7 met level. No active cardiac symptoms, CHF, arrhythmia. Plans for removal of bladder tumor that is relatively low risk. He is cleared to proceed from a cardiac standpoint without further testing."  VS: BP (!) 157/70   Pulse (!) 58   Temp 36.9 C (Oral)   Resp 18   Ht 5\' 4"  (1.626 m)   Wt 64.9 kg   SpO2 100%   BMI 24.55 kg/m   PROVIDERS: Ronnald Nian, MD is PCP   [place], Peter, MD is Cardiologist  LABS: Labs reviewed: Acceptable for surgery. (all labs ordered are listed, but only abnormal results are displayed)  Labs Reviewed  BASIC METABOLIC PANEL - Abnormal; Notable for the following components:      Result Value   Glucose, Bld 108 (*)    All other components within normal limits  CBC  NO BLOOD PRODUCTS     IMAGES:   EKG:   CV:  Past Medical History:  Diagnosis Date   Bladder mass    Cancer (HCC)    skin, basal   Coronary artery disease    Glaucoma    both eyes   Left inguinal hernia    Macular degeneration    both eyes   Pneumonia    as child   Sleep apnea    mild uses oral appliance    Past Surgical History:  Procedure Laterality Date   cagb x 4  11/1994   CATARACT EXTRACTION Bilateral 2013   Dr. Vonna Kotyk   HEMORRHOID SURGERY  1995   TONSILLECTOMY     age 63    MEDICATIONS:  brimonidine-timolol  (COMBIGAN) 0.2-0.5 % ophthalmic solution   latanoprost (XALATAN) 0.005 % ophthalmic solution   Latanoprostene Bunod (VYZULTA) 0.024 % SOLN   Multiple Vitamins-Minerals (VITAMINS/MINERALS PO)   No current facility-administered medications for this encounter.    Jodell Cipro Ward, PA-C [facility] [phone]

## 2023-05-05 NOTE — Anesthesia Preprocedure Evaluation (Addendum)
Anesthesia Evaluation  Patient identified by MRN, date of birth, ID band Patient awake    Reviewed: Allergy & Precautions, NPO status , Patient's Chart, lab work & pertinent test results  Airway Mallampati: III  TM Distance: >3 FB Neck ROM: Full    Dental no notable dental hx.    Pulmonary sleep apnea    Pulmonary exam normal        Cardiovascular + CAD and + CABG  Normal cardiovascular exam     Neuro/Psych negative neurological ROS     GI/Hepatic negative GI ROS, Neg liver ROS,,,  Endo/Other  negative endocrine ROS    Renal/GU negative Renal ROS     Musculoskeletal negative musculoskeletal ROS (+)    Abdominal   Peds  Hematology negative hematology ROS (+)   Anesthesia Other Findings BLADDER TUMOR  Reproductive/Obstetrics                              Anesthesia Physical Anesthesia Plan  ASA: 3  Anesthesia Plan: General   Post-op Pain Management:    Induction: Intravenous  PONV Risk Score and Plan: 2 and Ondansetron, Dexamethasone and Treatment may vary due to age or medical condition  Airway Management Planned: Oral ETT  Additional Equipment:   Intra-op Plan:   Post-operative Plan: Extubation in OR  Informed Consent: I have reviewed the patients History and Physical, chart, labs and discussed the procedure including the risks, benefits and alternatives for the proposed anesthesia with the patient or authorized representative who has indicated his/her understanding and acceptance.     Dental advisory given  Plan Discussed with: CRNA  Anesthesia Plan Comments: (PAT note 05/04/2023)        Anesthesia Quick Evaluation

## 2023-05-13 NOTE — Progress Notes (Signed)
Pts wife aware of surgical time change for Friday 05/14/2023. Pt to arrive at [facility] admitting at 1115 am. No food after midnight; clear liquids from midnight till 1030 am then nothing by mouth.

## 2023-05-14 ENCOUNTER — Ambulatory Visit (HOSPITAL_COMMUNITY): Payer: Medicare Other | Admitting: Physician Assistant

## 2023-05-14 ENCOUNTER — Encounter (HOSPITAL_COMMUNITY)
Admission: RE | Disposition: A | Discharge status: Home / Self Care | Payer: Self-pay | Source: Home / Self Care | Attending: Urology

## 2023-05-14 ENCOUNTER — Ambulatory Visit (HOSPITAL_BASED_OUTPATIENT_CLINIC_OR_DEPARTMENT_OTHER): Payer: Medicare Other | Admitting: Anesthesiology

## 2023-05-14 ENCOUNTER — Ambulatory Visit (HOSPITAL_COMMUNITY)
Admission: RE | Admit: 2023-05-14 | Discharge: 2023-05-14 | Disposition: A | Discharge status: Home / Self Care | Payer: Medicare Other | Attending: Urology | Admitting: Urology

## 2023-05-14 ENCOUNTER — Other Ambulatory Visit: Payer: Self-pay

## 2023-05-14 ENCOUNTER — Encounter (HOSPITAL_COMMUNITY): Payer: Self-pay | Admitting: Urology

## 2023-05-14 DIAGNOSIS — C672 Malignant neoplasm of lateral wall of bladder: Secondary | ICD-10-CM | POA: Insufficient documentation

## 2023-05-14 DIAGNOSIS — Z951 Presence of aortocoronary bypass graft: Secondary | ICD-10-CM | POA: Insufficient documentation

## 2023-05-14 DIAGNOSIS — C679 Malignant neoplasm of bladder, unspecified: Secondary | ICD-10-CM

## 2023-05-14 DIAGNOSIS — G4730 Sleep apnea, unspecified: Secondary | ICD-10-CM

## 2023-05-14 DIAGNOSIS — I2510 Atherosclerotic heart disease of native coronary artery without angina pectoris: Secondary | ICD-10-CM

## 2023-05-14 DIAGNOSIS — G4733 Obstructive sleep apnea (adult) (pediatric): Secondary | ICD-10-CM | POA: Diagnosis not present

## 2023-05-14 DIAGNOSIS — D494 Neoplasm of unspecified behavior of bladder: Secondary | ICD-10-CM | POA: Diagnosis not present

## 2023-05-14 HISTORY — PX: CYSTOSCOPY: SHX5120

## 2023-05-14 SURGERY — CYSTOSCOPY
Anesthesia: General | Site: Bladder

## 2023-05-14 MED ORDER — AMISULPRIDE (ANTIEMETIC) 5 MG/2ML IV SOLN: 10.0000 mg | Freq: Once | INTRAVENOUS | Status: DC | PRN

## 2023-05-14 MED ORDER — CIPROFLOXACIN IN D5W 400 MG/200ML IV SOLN
400.0000 mg | INTRAVENOUS | Status: AC
  Administered 2023-05-14: 400 mg via INTRAVENOUS
  Filled 2023-05-14: qty 200

## 2023-05-14 MED ORDER — CHLORHEXIDINE GLUCONATE 0.12 % MT SOLN: 15.0000 mL | Freq: Once | OROMUCOSAL | Status: DC

## 2023-05-14 MED ORDER — ACETAMINOPHEN 10 MG/ML IV SOLN
INTRAVENOUS | Status: AC
  Filled 2023-05-14: qty 100

## 2023-05-14 MED ORDER — PHENYLEPHRINE 80 MCG/ML (10ML) SYRINGE FOR IV PUSH (FOR BLOOD PRESSURE SUPPORT)
PREFILLED_SYRINGE | INTRAVENOUS | Status: DC | PRN
  Administered 2023-05-14: 120 ug via INTRAVENOUS
  Administered 2023-05-14: 80 ug via INTRAVENOUS

## 2023-05-14 MED ORDER — GEMCITABINE CHEMO FOR BLADDER INSTILLATION 2000 MG: 2000.0000 mg | Freq: Once | INTRAVENOUS | Status: DC

## 2023-05-14 MED ORDER — PROPOFOL 10 MG/ML IV BOLUS
INTRAVENOUS | Status: AC
  Filled 2023-05-14: qty 20

## 2023-05-14 MED ORDER — LACTATED RINGERS IV SOLN: INTRAVENOUS | Status: DC

## 2023-05-14 MED ORDER — SODIUM CHLORIDE 0.9 % IR SOLN
Status: DC | PRN
  Administered 2023-05-14: 3000 mL

## 2023-05-14 MED ORDER — PROPOFOL 10 MG/ML IV BOLUS
INTRAVENOUS | Status: DC | PRN
Start: 2023-05-14 — End: 2023-05-14
  Administered 2023-05-14: 50 mg via INTRAVENOUS
  Administered 2023-05-14: 100 mg via INTRAVENOUS
  Administered 2023-05-14: 50 mg via INTRAVENOUS

## 2023-05-14 MED ORDER — ROCURONIUM BROMIDE 10 MG/ML (PF) SYRINGE
PREFILLED_SYRINGE | INTRAVENOUS | Status: AC
  Filled 2023-05-14: qty 10

## 2023-05-14 MED ORDER — ROCURONIUM BROMIDE 10 MG/ML (PF) SYRINGE
PREFILLED_SYRINGE | INTRAVENOUS | Status: DC | PRN
  Administered 2023-05-14: 30 mg via INTRAVENOUS

## 2023-05-14 MED ORDER — FENTANYL CITRATE (PF) 100 MCG/2ML IJ SOLN
INTRAMUSCULAR | Status: DC | PRN
  Administered 2023-05-14: 100 ug via INTRAVENOUS

## 2023-05-14 MED ORDER — ACETAMINOPHEN 10 MG/ML IV SOLN
1000.0000 mg | Freq: Once | INTRAVENOUS | Status: DC | PRN
  Administered 2023-05-14: 1000 mg via INTRAVENOUS

## 2023-05-14 MED ORDER — OXYBUTYNIN CHLORIDE 5 MG PO TABS: 5.0000 mg | ORAL_TABLET | Freq: Three times a day (TID) | ORAL | 1 refills | Status: AC | PRN

## 2023-05-14 MED ORDER — PHENAZOPYRIDINE HCL 200 MG PO TABS: 200.0000 mg | ORAL_TABLET | Freq: Three times a day (TID) | ORAL | 0 refills | Status: AC | PRN

## 2023-05-14 MED ORDER — GEMCITABINE CHEMO FOR BLADDER INSTILLATION 2000 MG
2000.0000 mg | Freq: Once | INTRAVENOUS | Status: AC
  Administered 2023-05-14: 2000 mg via INTRAVESICAL
  Filled 2023-05-14: qty 2000

## 2023-05-14 MED ORDER — FENTANYL CITRATE (PF) 100 MCG/2ML IJ SOLN
INTRAMUSCULAR | Status: AC
  Filled 2023-05-14: qty 2

## 2023-05-14 MED ORDER — FENTANYL CITRATE PF 50 MCG/ML IJ SOSY
PREFILLED_SYRINGE | INTRAMUSCULAR | Status: AC
  Administered 2023-05-14: 50 ug via INTRAVENOUS
  Filled 2023-05-14: qty 2

## 2023-05-14 MED ORDER — DEXAMETHASONE SODIUM PHOSPHATE 10 MG/ML IJ SOLN
INTRAMUSCULAR | Status: DC | PRN
  Administered 2023-05-14: 8 mg via INTRAVENOUS

## 2023-05-14 MED ORDER — ORAL CARE MOUTH RINSE: 15.0000 mL | Freq: Once | OROMUCOSAL | Status: DC

## 2023-05-14 MED ORDER — DEXAMETHASONE SODIUM PHOSPHATE 10 MG/ML IJ SOLN
INTRAMUSCULAR | Status: AC
  Filled 2023-05-14: qty 1

## 2023-05-14 MED ORDER — ONDANSETRON HCL 4 MG/2ML IJ SOLN: 4.0000 mg | Freq: Once | INTRAMUSCULAR | Status: DC | PRN

## 2023-05-14 MED ORDER — ONDANSETRON HCL 4 MG/2ML IJ SOLN
INTRAMUSCULAR | Status: AC
  Filled 2023-05-14: qty 2

## 2023-05-14 MED ORDER — LIDOCAINE 2% (20 MG/ML) 5 ML SYRINGE
INTRAMUSCULAR | Status: DC | PRN
  Administered 2023-05-14: 60 mg via INTRAVENOUS

## 2023-05-14 MED ORDER — SUGAMMADEX SODIUM 200 MG/2ML IV SOLN
INTRAVENOUS | Status: DC | PRN
  Administered 2023-05-14: 200 mg via INTRAVENOUS

## 2023-05-14 MED ORDER — ONDANSETRON HCL 4 MG/2ML IJ SOLN
INTRAMUSCULAR | Status: DC | PRN
  Administered 2023-05-14: 4 mg via INTRAVENOUS

## 2023-05-14 MED ORDER — LIDOCAINE HCL (PF) 2 % IJ SOLN
INTRAMUSCULAR | Status: AC
  Filled 2023-05-14: qty 5

## 2023-05-14 MED ORDER — FENTANYL CITRATE PF 50 MCG/ML IJ SOSY
25.0000 ug | PREFILLED_SYRINGE | INTRAMUSCULAR | Status: DC | PRN
  Administered 2023-05-14: 50 ug via INTRAVENOUS

## 2023-05-14 SURGICAL SUPPLY — 11 items
BAG URO CATCHER STRL LF (MISCELLANEOUS) ×1 IMPLANT
CATH URETL OPEN 5X70 (CATHETERS) ×1 IMPLANT
CLOTH BEACON ORANGE TIMEOUT ST (SAFETY) ×1 IMPLANT
GLOVE SURG LX STRL 7.5 STRW (GLOVE) ×1 IMPLANT
GOWN STRL REUS W/ TWL XL LVL3 (GOWN DISPOSABLE) ×1 IMPLANT
GOWN STRL REUS W/TWL XL LVL3 (GOWN DISPOSABLE) ×1
GUIDEWIRE ZIPWRE .038 STRAIGHT (WIRE) ×1 IMPLANT
KIT TURNOVER KIT A (KITS) IMPLANT
MANIFOLD NEPTUNE II (INSTRUMENTS) ×1 IMPLANT
PACK CYSTO (CUSTOM PROCEDURE TRAY) ×1 IMPLANT
TUBING CONNECTING 10 (TUBING) ×1 IMPLANT

## 2023-05-14 NOTE — Transfer of Care (Signed)
Immediate Anesthesia Transfer of Care Note  Patient: Aaron Bullock  Procedure(s) Performed: CYSTOSCOPY (Bladder) TRANSURETHRAL RESECTION OF BLADDER TUMOR (TURBT) with GEMCITABINE INSTILLATION (Bladder)  Patient Location: PACU  Anesthesia Type:General  Level of Consciousness: drowsy and patient cooperative  Airway & Oxygen Therapy: Patient Spontanous Breathing and Patient connected to face mask oxygen  Post-op Assessment: Report given to RN and Post -op Vital signs reviewed and stable  Post vital signs: Reviewed and stable  Last Vitals:  Vitals Value Taken Time  BP 153/116 05/14/23 1435  Temp 36.4 C 05/14/23 1435  Pulse 68 05/14/23 1438  Resp 14 05/14/23 1438  SpO2 100 % 05/14/23 1438  Vitals shown include unfiled device data.  Last Pain:  Vitals:   05/14/23 1435  TempSrc: Oral  PainSc:          Complications: No notable events documented.

## 2023-05-14 NOTE — Op Note (Signed)
Operative Note  Preoperative diagnosis:  1.  2 cm papillary bladder tumor involving the left sidewall  Postoperative diagnosis: 1.  2 cm papillary bladder tumor involving the left sidewall  Procedure(s): 1.  TURBT medium 2.  Intravesical instillation of gemcitabine  Surgeon: Rhoderick Moody, MD  Assistants:  None  Anesthesia:  General  Complications:  None  EBL: 10 mL  Specimens: 1.  Superficial and deep margins of the left bladder tumor  Drains/Catheters: 1.  18 French Foley catheter  Intraoperative findings:   Papillary bladder tumor involving the left sidewall that was adjacent to the left ureteral orifice, but not directly involving it.  No other intravesical abnormalities were seen.  Indication:  Aaron Bullock is a 82 y.o. male with a 2 summative papillary bladder tumor with features concerning for urothelial cell carcinoma.  He has been consented for the above procedures, voices understanding wishes to proceed.  Description of procedure:  After informed consent was obtained, the patient was brought to the operating room and general endotracheal anesthesia was administered. The patient was then placed in the dorsolithotomy position and prepped and draped in the usual sterile fashion. A timeout was performed. A 23 French rigid cystoscope was then inserted into the urethral meatus and advanced into the bladder under direct vision. A complete bladder survey revealed a 2 cm papillary bladder tumor involving the left bladder sidewall with no other intravesical or urethral abnormalities.  The rigid cystoscope was then exchanged for a 26 French resectoscope with a bipolar loop working element.  The bladder tumor was then resected down to the detrusor musculature.  Superficial and deep margins were sent for pathologic analysis.  The area that was resected was immediately adjacent to, but not directly involving the left ureteral orifice.  The resection was then extensively  fulgurated until hemostasis was achieved.  The resectoscope was then removed.  An 77 French Foley catheter was placed. While in the recovery room 2000 mg of gemcitabine in 50 mL of water was instilled in the bladder through the catheter and the catheter was plugged. This will remain indwelling for approximately one hour. It will then be drained from the bladder and the catheter will be removed and the patient discharged home.  Plan: Drain bladder and remove Foley catheter 1 hour after gemcitabine instillation.  Discharge home.

## 2023-05-14 NOTE — H&P (Signed)
Office Visit Report     04/29/2023   --------------------------------------------------------------------------------   Aaron Bullock  MRN: [id number]  DOB: 08/09/1941, 82 year old Male  PRIMARY CARE:     REFERRING:   A. Liliane Shi, MD  PROVIDER:  Rhoderick Moody, M.D.  LOCATION:  [facility] [phone]     --------------------------------------------------------------------------------   CC/HPI: Bladder mass   [name] is an 82 year old male referred by Dr. Andrey Bullock after he was found to have a solid and enhancing bladder mass on CT from 04/2022 during a work-up for left lower quadrant pain.   12/24/22: The patient is here today for a routine follow-up. He refused TURBT after his last visit. Recent surveillance CT showed slight interval growth of his bladder mass (now measuring 1.6 cm). He has not establish care with a cardiologist yet for clearance prior to surgery. From a urinary standpoint, he reports a good force of stream and feels like he is emptying his bladder well. He denies interval UTIs, dysuria or gross hematuria.   04/29/23: Patient is here today for a surveillance cystoscopy after canceling his TURBT several months ago. He has done well in the interim and denies interval UTIs, dysuria or hematuria.     ALLERGIES: Sulfa    MEDICATIONS: Combigan 0.2 %-0.5 % drops PO Daily  Latanoprost     GU PSH: Locm 300-399Mg /Ml Iodine,1Ml - 12/09/2022     NON-GU PSH: Coronary Artery Bypass Grafting Hemorrhoidectomy Visit Complexity (formerly GPC1X) - 12/24/2022     GU PMH: Bladder tumor/neoplasm - 12/24/2022, - 12/09/2022, - 05/07/2022    NON-GU PMH: Atherosclerotic heart disease of native coronary artery without angina pectoris Glaucoma Hypercholesterolemia    FAMILY HISTORY: No Family History    SOCIAL HISTORY: Marital Status: Married Preferred Language: English; Ethnicity: Not Hispanic Or Latino; Race: White Current Smoking Status: Patient has  never smoked.   Tobacco Use Assessment Completed: Used Tobacco in last 30 days? Light Drinker.  Drinks 2 caffeinated drinks per day.    REVIEW OF SYSTEMS:    GU Review Male:   Patient denies frequent urination, hard to postpone urination, burning/ pain with urination, get up at night to urinate, leakage of urine, stream starts and stops, trouble starting your stream, have to strain to urinate , erection problems, and penile pain.  Gastrointestinal (Upper):   Patient denies nausea, vomiting, and indigestion/ heartburn.  Gastrointestinal (Lower):   Patient denies diarrhea and constipation.  Constitutional:   Patient denies fever, night sweats, weight loss, and fatigue.  Skin:   Patient denies skin rash/ lesion and itching.  Eyes:   Patient denies blurred vision and double vision.  Ears/ Nose/ Throat:   Patient denies sore throat and sinus problems.  Hematologic/Lymphatic:   Patient denies swollen glands and easy bruising.  Cardiovascular:   Patient denies leg swelling and chest pains.  Respiratory:   Patient denies cough and shortness of breath.  Endocrine:   Patient denies excessive thirst.  Musculoskeletal:   Patient denies back pain and joint pain.  Neurological:   Patient denies headaches and dizziness.  Psychologic:   Patient denies depression and anxiety.   VITAL SIGNS: None   GU PHYSICAL EXAMINATION:    Urethral Meatus: Normal size. No lesion, no wart, no discharge, no polyp. Normal location.  Penis: Circumcised, no warts, no cracks. No dorsal Peyronie's plaques, no left corporal Peyronie's plaques, no right corporal Peyronie's plaques, no scarring, no warts. No balanitis, no meatal stenosis.   MULTI-SYSTEM PHYSICAL EXAMINATION:  Constitutional: Well-nourished. No physical deformities. Normally developed. Good grooming.  Respiratory: Labored breathing. No use of accessory muscles.   Cardiovascular: Normal temperature, normal extremity pulses, no swelling, no varicosities.   Neurologic / Psychiatric: Oriented to time, oriented to place, oriented to person. No depression, no anxiety, no agitation.  Musculoskeletal: Normal gait and station of head and neck.     Complexity of Data:  Records Review:   Previous Hospital Records, Previous Patient Records   PROCEDURES:         Flexible Cystoscopy - 52000  Risks, benefits, and some of the potential complications of the procedure were discussed at length with the patient including infection, bleeding, voiding discomfort, urinary retention, fever, chills, sepsis, and others. All questions were answered. Informed consent was obtained. Antibiotic prophylaxis was given. Sterile technique and intraurethral analgesia were used.  Meatus:  Normal size. Normal location. Normal condition.  Urethra:  No strictures.  External Sphincter:  Normal.  Verumontanum:  Normal.  Prostate:  Non-obstructing. No hyperplasia.  Bladder Neck:  Non-obstructing.  Ureteral Orifices:  Normal location. Normal size. Normal shape. Effluxed clear urine.  Bladder:  A left lateral wall tumor. 2 cm tumor. No trabeculation. Normal mucosa. No stones.      The lower urinary tract was carefully examined. The procedure was well-tolerated and without complications. Antibiotic instructions were given. Instructions were given to call the office immediately for bloody urine, difficulty urinating, urinary retention, painful or frequent urination, fever, chills, nausea, vomiting or other illness. The patient stated that he understood these instructions and would comply with them.         Visit Complexity - G2211    ASSESSMENT:      ICD-10 Details  1 GU:   Bladder tumor/neoplasm - D41.4 Chronic, Stable   PLAN:           Schedule Return Visit/Planned Activity: Next Available Appointment - Schedule Surgery          Document Letter(s):  Created for Patient: Clinical Summary         Notes:   The risks, benefits and alternatives of cystoscopy with TURBT with  intravesical gemcitabine instillation, bilateral retrograde pyelograms and possible bilateral ureteral stents was discussed with the patient. The risks include, but are not limited to, bleeding, urinary tract infection, bladder perforation requiring prolonged catheterization and/or open bladder repair, ureteral obstruction, voiding dysfunction and the inherent risks of general anesthesia. The patient voices understanding and wishes to proceed.

## 2023-05-14 NOTE — Anesthesia Procedure Notes (Signed)
Procedure Name: Intubation Date/Time: 05/14/2023 1:47 PM  Performed by: Epimenio Sarin, CRNAPre-anesthesia Checklist: Patient identified, Emergency Drugs available, Suction available, Patient being monitored and Timeout performed Patient Re-evaluated:Patient Re-evaluated prior to induction Oxygen Delivery Method: Circle system utilized Preoxygenation: Pre-oxygenation with 100% oxygen Induction Type: IV induction Ventilation: Mask ventilation without difficulty and Oral airway inserted - appropriate to patient size Laryngoscope Size: 3 and Glidescope Grade View: Grade III Tube type: Oral Tube size: 7.5 mm Number of attempts: 2 Airway Equipment and Method: Rigid stylet and Video-laryngoscopy Placement Confirmation: ETT inserted through vocal cords under direct vision, positive ETCO2 and breath sounds checked- equal and bilateral Secured at: 23 cm Tube secured with: Tape Dental Injury: Teeth and Oropharynx as per pre-operative assessment  Difficulty Due To: Difficult Airway- due to anterior larynx Comments: Mac 3 grade 3 view, anterior airway. Glidescope 3, grade 1 view, ETT easily passed. VSS. BBS =

## 2023-05-17 ENCOUNTER — Encounter (HOSPITAL_COMMUNITY): Payer: Self-pay | Admitting: Urology

## 2023-05-17 DIAGNOSIS — C689 Malignant neoplasm of urinary organ, unspecified: Secondary | ICD-10-CM | POA: Insufficient documentation

## 2023-05-17 NOTE — Anesthesia Postprocedure Evaluation (Signed)
Anesthesia Post Note  Patient: Aaron Bullock  Procedure(s) Performed: CYSTOSCOPY (Bladder) TRANSURETHRAL RESECTION OF BLADDER TUMOR (TURBT) with GEMCITABINE INSTILLATION (Bladder)     Patient location during evaluation: PACU Anesthesia Type: General Level of consciousness: awake Pain management: pain level controlled Vital Signs Assessment: post-procedure vital signs reviewed and stable Respiratory status: spontaneous breathing, nonlabored ventilation and respiratory function stable Cardiovascular status: blood pressure returned to baseline and stable Postop Assessment: no apparent nausea or vomiting Anesthetic complications: no   No notable events documented.  Last Vitals:  Vitals:   05/14/23 1600 05/14/23 1615  BP: (!) 151/67 (!) 148/84  Pulse: (!) 58 (!) 57  Resp: 17 20  Temp:  36.4 C  SpO2: 100% 99%    Last Pain:  Vitals:   05/14/23 1615  TempSrc:   PainSc: 0-No pain                  P 

## 2023-05-20 DIAGNOSIS — H353221 Exudative age-related macular degeneration, left eye, with active choroidal neovascularization: Secondary | ICD-10-CM | POA: Diagnosis not present

## 2023-05-20 DIAGNOSIS — H353211 Exudative age-related macular degeneration, right eye, with active choroidal neovascularization: Secondary | ICD-10-CM | POA: Diagnosis not present

## 2023-05-20 DIAGNOSIS — H401113 Primary open-angle glaucoma, right eye, severe stage: Secondary | ICD-10-CM | POA: Diagnosis not present

## 2023-05-20 DIAGNOSIS — H35033 Hypertensive retinopathy, bilateral: Secondary | ICD-10-CM | POA: Diagnosis not present

## 2023-05-20 DIAGNOSIS — H35022 Exudative retinopathy, left eye: Secondary | ICD-10-CM | POA: Diagnosis not present

## 2023-05-20 DIAGNOSIS — D869 Sarcoidosis, unspecified: Secondary | ICD-10-CM | POA: Diagnosis not present

## 2023-05-20 DIAGNOSIS — H35721 Serous detachment of retinal pigment epithelium, right eye: Secondary | ICD-10-CM | POA: Diagnosis not present

## 2023-05-20 DIAGNOSIS — H353122 Nonexudative age-related macular degeneration, left eye, intermediate dry stage: Secondary | ICD-10-CM | POA: Diagnosis not present

## 2023-05-24 DIAGNOSIS — C672 Malignant neoplasm of lateral wall of bladder: Secondary | ICD-10-CM | POA: Diagnosis not present

## 2023-05-25 DIAGNOSIS — H353122 Nonexudative age-related macular degeneration, left eye, intermediate dry stage: Secondary | ICD-10-CM | POA: Diagnosis not present

## 2023-05-25 DIAGNOSIS — H35022 Exudative retinopathy, left eye: Secondary | ICD-10-CM | POA: Diagnosis not present

## 2023-05-25 DIAGNOSIS — H35033 Hypertensive retinopathy, bilateral: Secondary | ICD-10-CM | POA: Diagnosis not present

## 2023-05-25 DIAGNOSIS — H353231 Exudative age-related macular degeneration, bilateral, with active choroidal neovascularization: Secondary | ICD-10-CM | POA: Diagnosis not present

## 2023-05-25 DIAGNOSIS — H2012 Chronic iridocyclitis, left eye: Secondary | ICD-10-CM | POA: Diagnosis not present

## 2023-05-25 DIAGNOSIS — H35721 Serous detachment of retinal pigment epithelium, right eye: Secondary | ICD-10-CM | POA: Diagnosis not present

## 2023-05-25 DIAGNOSIS — H16142 Punctate keratitis, left eye: Secondary | ICD-10-CM | POA: Diagnosis not present

## 2023-05-25 DIAGNOSIS — H401113 Primary open-angle glaucoma, right eye, severe stage: Secondary | ICD-10-CM | POA: Diagnosis not present

## 2023-05-26 DIAGNOSIS — C679 Malignant neoplasm of bladder, unspecified: Secondary | ICD-10-CM | POA: Insufficient documentation

## 2023-05-26 DIAGNOSIS — H35039 Hypertensive retinopathy, unspecified eye: Secondary | ICD-10-CM | POA: Insufficient documentation

## 2023-06-03 DIAGNOSIS — H401132 Primary open-angle glaucoma, bilateral, moderate stage: Secondary | ICD-10-CM | POA: Diagnosis not present

## 2023-06-03 DIAGNOSIS — H47011 Ischemic optic neuropathy, right eye: Secondary | ICD-10-CM | POA: Diagnosis not present

## 2023-06-03 DIAGNOSIS — H04122 Dry eye syndrome of left lacrimal gland: Secondary | ICD-10-CM | POA: Diagnosis not present

## 2023-06-03 DIAGNOSIS — H43812 Vitreous degeneration, left eye: Secondary | ICD-10-CM | POA: Diagnosis not present

## 2023-06-03 DIAGNOSIS — H18413 Arcus senilis, bilateral: Secondary | ICD-10-CM | POA: Diagnosis not present

## 2023-06-08 DIAGNOSIS — Z5111 Encounter for antineoplastic chemotherapy: Secondary | ICD-10-CM | POA: Diagnosis not present

## 2023-06-08 DIAGNOSIS — H16142 Punctate keratitis, left eye: Secondary | ICD-10-CM | POA: Diagnosis not present

## 2023-06-08 DIAGNOSIS — H2012 Chronic iridocyclitis, left eye: Secondary | ICD-10-CM | POA: Diagnosis not present

## 2023-06-08 DIAGNOSIS — H401113 Primary open-angle glaucoma, right eye, severe stage: Secondary | ICD-10-CM | POA: Diagnosis not present

## 2023-06-08 DIAGNOSIS — H35033 Hypertensive retinopathy, bilateral: Secondary | ICD-10-CM | POA: Diagnosis not present

## 2023-06-08 DIAGNOSIS — H353231 Exudative age-related macular degeneration, bilateral, with active choroidal neovascularization: Secondary | ICD-10-CM | POA: Diagnosis not present

## 2023-06-08 DIAGNOSIS — H18412 Arcus senilis, left eye: Secondary | ICD-10-CM | POA: Diagnosis not present

## 2023-06-08 DIAGNOSIS — H02224 Mechanical lagophthalmos left upper eyelid: Secondary | ICD-10-CM | POA: Diagnosis not present

## 2023-06-08 DIAGNOSIS — C672 Malignant neoplasm of lateral wall of bladder: Secondary | ICD-10-CM | POA: Diagnosis not present

## 2023-06-08 DIAGNOSIS — H35721 Serous detachment of retinal pigment epithelium, right eye: Secondary | ICD-10-CM | POA: Diagnosis not present

## 2023-06-08 DIAGNOSIS — H35022 Exudative retinopathy, left eye: Secondary | ICD-10-CM | POA: Diagnosis not present

## 2023-06-08 DIAGNOSIS — H353122 Nonexudative age-related macular degeneration, left eye, intermediate dry stage: Secondary | ICD-10-CM | POA: Diagnosis not present

## 2023-06-15 DIAGNOSIS — C672 Malignant neoplasm of lateral wall of bladder: Secondary | ICD-10-CM | POA: Diagnosis not present

## 2023-06-15 DIAGNOSIS — Z5111 Encounter for antineoplastic chemotherapy: Secondary | ICD-10-CM | POA: Diagnosis not present

## 2023-06-22 DIAGNOSIS — C672 Malignant neoplasm of lateral wall of bladder: Secondary | ICD-10-CM | POA: Diagnosis not present

## 2023-06-22 DIAGNOSIS — Z5111 Encounter for antineoplastic chemotherapy: Secondary | ICD-10-CM | POA: Diagnosis not present

## 2023-06-29 DIAGNOSIS — C672 Malignant neoplasm of lateral wall of bladder: Secondary | ICD-10-CM | POA: Diagnosis not present

## 2023-06-29 DIAGNOSIS — Z5111 Encounter for antineoplastic chemotherapy: Secondary | ICD-10-CM | POA: Diagnosis not present

## 2023-07-06 DIAGNOSIS — Z5111 Encounter for antineoplastic chemotherapy: Secondary | ICD-10-CM | POA: Diagnosis not present

## 2023-07-06 DIAGNOSIS — C672 Malignant neoplasm of lateral wall of bladder: Secondary | ICD-10-CM | POA: Diagnosis not present

## 2023-07-13 DIAGNOSIS — C672 Malignant neoplasm of lateral wall of bladder: Secondary | ICD-10-CM | POA: Diagnosis not present

## 2023-07-13 DIAGNOSIS — Z5111 Encounter for antineoplastic chemotherapy: Secondary | ICD-10-CM | POA: Diagnosis not present

## 2023-07-14 DIAGNOSIS — H353221 Exudative age-related macular degeneration, left eye, with active choroidal neovascularization: Secondary | ICD-10-CM | POA: Diagnosis not present

## 2023-07-14 DIAGNOSIS — H18412 Arcus senilis, left eye: Secondary | ICD-10-CM | POA: Diagnosis not present

## 2023-07-14 DIAGNOSIS — H35022 Exudative retinopathy, left eye: Secondary | ICD-10-CM | POA: Diagnosis not present

## 2023-07-14 DIAGNOSIS — H35721 Serous detachment of retinal pigment epithelium, right eye: Secondary | ICD-10-CM | POA: Diagnosis not present

## 2023-07-14 DIAGNOSIS — H16142 Punctate keratitis, left eye: Secondary | ICD-10-CM | POA: Diagnosis not present

## 2023-07-14 DIAGNOSIS — H353122 Nonexudative age-related macular degeneration, left eye, intermediate dry stage: Secondary | ICD-10-CM | POA: Diagnosis not present

## 2023-07-14 DIAGNOSIS — H02224 Mechanical lagophthalmos left upper eyelid: Secondary | ICD-10-CM | POA: Diagnosis not present

## 2023-07-14 DIAGNOSIS — H353211 Exudative age-related macular degeneration, right eye, with active choroidal neovascularization: Secondary | ICD-10-CM | POA: Diagnosis not present

## 2023-07-14 DIAGNOSIS — H35033 Hypertensive retinopathy, bilateral: Secondary | ICD-10-CM | POA: Diagnosis not present

## 2023-07-14 DIAGNOSIS — H401113 Primary open-angle glaucoma, right eye, severe stage: Secondary | ICD-10-CM | POA: Diagnosis not present

## 2023-07-14 DIAGNOSIS — H353132 Nonexudative age-related macular degeneration, bilateral, intermediate dry stage: Secondary | ICD-10-CM | POA: Diagnosis not present

## 2023-07-14 DIAGNOSIS — H2012 Chronic iridocyclitis, left eye: Secondary | ICD-10-CM | POA: Diagnosis not present

## 2023-07-19 DIAGNOSIS — H18413 Arcus senilis, bilateral: Secondary | ICD-10-CM | POA: Diagnosis not present

## 2023-07-19 DIAGNOSIS — H02224 Mechanical lagophthalmos left upper eyelid: Secondary | ICD-10-CM | POA: Diagnosis not present

## 2023-07-19 DIAGNOSIS — H43812 Vitreous degeneration, left eye: Secondary | ICD-10-CM | POA: Diagnosis not present

## 2023-07-19 DIAGNOSIS — H401132 Primary open-angle glaucoma, bilateral, moderate stage: Secondary | ICD-10-CM | POA: Diagnosis not present

## 2023-07-20 DIAGNOSIS — Z961 Presence of intraocular lens: Secondary | ICD-10-CM | POA: Diagnosis not present

## 2023-07-20 DIAGNOSIS — Z79899 Other long term (current) drug therapy: Secondary | ICD-10-CM | POA: Diagnosis not present

## 2023-07-20 DIAGNOSIS — H353211 Exudative age-related macular degeneration, right eye, with active choroidal neovascularization: Secondary | ICD-10-CM | POA: Diagnosis not present

## 2023-07-20 DIAGNOSIS — H353122 Nonexudative age-related macular degeneration, left eye, intermediate dry stage: Secondary | ICD-10-CM | POA: Diagnosis not present

## 2023-07-20 DIAGNOSIS — H3581 Retinal edema: Secondary | ICD-10-CM | POA: Diagnosis not present

## 2023-07-20 DIAGNOSIS — H30033 Focal chorioretinal inflammation, peripheral, bilateral: Secondary | ICD-10-CM | POA: Diagnosis not present

## 2023-07-29 ENCOUNTER — Ambulatory Visit: Payer: Medicare Other

## 2023-07-29 DIAGNOSIS — Z0000 Encounter for general adult medical examination without abnormal findings: Secondary | ICD-10-CM

## 2023-07-29 NOTE — Progress Notes (Signed)
Subjective:   KWMANE BRADEN is a 82 y.o. male who presents for Medicare Annual/Subsequent preventive examination.  Visit Complete: Virtual I connected with  Olene Craven on 07/29/23 by a audio enabled telemedicine application and verified that I am speaking with the correct person using two identifiers.  Patient Location: Home  Provider Location: Office/Clinic  I discussed the limitations of evaluation and management by telemedicine. The patient expressed understanding and agreed to proceed.  Vital Signs: Because this visit was a virtual/telehealth visit, some criteria may be missing or patient reported. Any vitals not documented were not able to be obtained and vitals that have been documented are patient reported.    Cardiac Risk Factors include: advanced age (>44men, >57 women);male gender     Objective:    Today's Vitals   There is no height or weight on file to calculate BMI.     07/29/2023   11:44 AM 05/14/2023   11:16 AM 05/04/2023    9:06 AM 06/05/2022    2:29 PM 04/28/2021    2:09 PM  Advanced Directives  Does Patient Have a Medical Advance Directive? Yes Yes Yes Yes Yes  Type of Estate agent of [place];Living will Healthcare Power of [place];Living will Healthcare Power of [place];Living will Healthcare Power of [place];Living will Healthcare Power of [place];Living will  Does patient want to make changes to medical advance directive?  No - Guardian declined   Yes (ED - Information included in AVS)  Copy of Healthcare Power of Attorney in Chart? Yes - validated most recent copy scanned in chart (See row information) No - copy requested No - copy requested Yes - validated most recent copy scanned in chart (See row information) Yes - validated most recent copy scanned in chart (See row information)    Current Medications (verified) Outpatient Encounter Medications as of 07/29/2023  Medication Sig   brimonidine-timolol (COMBIGAN) 0.2-0.5 %  ophthalmic solution Place 1 drop into both eyes in the morning and at bedtime.   latanoprost (XALATAN) 0.005 % ophthalmic solution Place 1 drop into both eyes at bedtime.   Multiple Vitamins-Minerals (VITAMINS/MINERALS PO) Take 1 tablet by mouth daily.   oxybutynin (DITROPAN) 5 MG tablet Take 1 tablet (5 mg total) by mouth every 8 (eight) hours as needed for bladder spasms.   Latanoprostene Bunod (VYZULTA) 0.024 % SOLN One drop to right eye in morning (Patient not taking: Reported on 04/29/2023)   phenazopyridine (PYRIDIUM) 200 MG tablet Take 1 tablet (200 mg total) by mouth 3 (three) times daily as needed (for pain with urination). (Patient not taking: Reported on 07/29/2023)   No facility-administered encounter medications on file as of 07/29/2023.    Allergies (verified) Penicillins and Sulfacetamide sodium   History: Past Medical History:  Diagnosis Date   Bladder mass    Cancer (HCC)    skin, basal   Coronary artery disease    Glaucoma    both eyes   Left inguinal hernia    Macular degeneration    both eyes   Pneumonia    as child   Sleep apnea    mild uses oral appliance   Past Surgical History:  Procedure Laterality Date   cagb x 4  11/1994   CATARACT EXTRACTION Bilateral 2013   Dr. Vonna Kotyk   CYSTOSCOPY N/A 05/14/2023   Procedure: Bluford Kaufmann;  Surgeon: Rene Paci, MD;  Location: [facility];  Service: Urology;  Laterality: N/A;  45 MINUTES   HEMORRHOID SURGERY  1995  TONSILLECTOMY     age 8   Family History  Problem Relation Age of Onset   Arrhythmia Mother    Heart attack Father 76   Heart disease Father    Breast cancer Sister    Heart disease Brother        CABG   Amblyopia Neg Hx    Blindness Neg Hx    Cancer Neg Hx    Cataracts Neg Hx    Diabetes Neg Hx    Glaucoma Neg Hx    Hypertension Neg Hx    Macular degeneration Neg Hx    Retinal detachment Neg Hx    Strabismus Neg Hx    Stroke Neg Hx    Thyroid disease Neg Hx    Retinitis  pigmentosa Neg Hx    Social History   Socioeconomic History   Marital status: Married    Spouse name: Not on file   Number of children: 0   Years of education: Not on file   Highest education level: Not on file  Occupational History   Not on file  Tobacco Use   Smoking status: Never   Smokeless tobacco: Never  Vaping Use   Vaping status: Never Used  Substance and Sexual Activity   Alcohol use: Yes    Alcohol/week: 1.0 standard drink of alcohol    Types: 1 Glasses of wine per week    Comment: 1 glass wine per day   Drug use: Never   Sexual activity: Not Currently  Other Topics Concern   Not on file  Social History Narrative   Not on file   Social Determinants of Health   Financial Resource Strain: Low Risk  (07/29/2023)   Overall Financial Resource Strain (CARDIA)    Difficulty of Paying Living Expenses: Not hard at all  Food Insecurity: No Food Insecurity (07/29/2023)   Hunger Vital Sign    Worried About Running Out of Food in the Last Year: Never true    Ran Out of Food in the Last Year: Never true  Transportation Needs: No Transportation Needs (07/29/2023)   PRAPARE - Administrator, Civil Service (Medical): No    Lack of Transportation (Non-Medical): No  Physical Activity: Unknown (07/29/2023)   Exercise Vital Sign    Days of Exercise per Week: Patient declined    Minutes of Exercise per Session: Not on file  Stress: Stress Concern Present (07/29/2023)   [employer] of Occupational Health - Occupational Stress Questionnaire    Feeling of Stress : To some extent  Social Connections: Moderately Isolated (07/29/2023)   Social Connection and Isolation Panel [NHANES]    Frequency of Communication with Friends and Family: Never    Frequency of Social Gatherings with Friends and Family: Never    Attends Religious Services: Never    Database administrator or Organizations: Yes    Attends Engineer, structural: Not on file    Marital Status:  Married    Tobacco Counseling Counseling given: Not Answered   Clinical Intake:  Pre-visit preparation completed: Yes  Pain : No/denies pain     Nutritional Risks: None Diabetes: No  How often do you need to have someone help you when you read instructions, pamphlets, or other written materials from your doctor or pharmacy?: 4 - Often  Interpreter Needed?: No  Information entered by :: NAllen LPN   Activities of Daily Living    07/29/2023   11:28 AM 05/04/2023    9:10 AM  In your present state of health, do you have any difficulty performing the following activities:  Hearing? 0   Vision? 1   Comment vision issues   Difficulty concentrating or making decisions? 0   Walking or climbing stairs? 0   Dressing or bathing? 0   Doing errands, shopping? 1 0  Comment does not drive   Preparing Food and eating ? N   Using the Toilet? N   In the past six months, have you accidently leaked urine? N   Do you have problems with loss of bowel control? N   Managing your Medications? Y   Managing your Finances? Y   Housekeeping or managing your Housekeeping? N     Patient Care Team: Ronnald Nian, MD as PCP - General (Family Medicine)  Indicate any recent Medical Services you may have received from other than [facility] providers in the past year (date may be approximate).     Assessment:   This is a routine wellness examination for [place].  Hearing/Vision screen Hearing Screening - Comments:: Denies hearing issues Vision Screening - Comments:: Regular eye exams   Goals Addressed             This Visit's Progress    Patient Stated       07/29/2023, limit carbs       Depression Screen    07/29/2023   11:46 AM 06/05/2022    2:30 PM 06/01/2022    8:21 AM 04/28/2021    2:05 PM 10/14/2018   10:06 AM  PHQ 2/9 Scores  PHQ - 2 Score 0 0 0 0 0  PHQ- 9 Score  2       Fall Risk    07/29/2023   11:45 AM 06/05/2022    2:30 PM 06/01/2022    8:21 AM 04/28/2021    2:05  PM  Fall Risk   Falls in the past year? 0 1 1 0  Comment  missed a step    Number falls in past yr: 0 0 0 0  Injury with Fall? 0 1 1 0  Comment  sprained finger    Risk for fall due to : Medication side effect;Impaired vision Medication side effect Impaired mobility No Fall Risks  Follow up Falls prevention discussed;Falls evaluation completed Falls evaluation completed;Education provided;Falls prevention discussed Falls evaluation completed Falls evaluation completed    MEDICARE RISK AT HOME: Medicare Risk at Home Any stairs in or around the home?: Yes If so, are there any without handrails?: No Home free of loose throw rugs in walkways, pet beds, electrical cords, etc?: Yes Adequate lighting in your home to reduce risk of falls?: Yes Life alert?: No Use of a cane, walker or w/c?: No Grab bars in the bathroom?: No Shower chair or bench in shower?: Yes Elevated toilet seat or a handicapped toilet?: Yes  TIMED UP AND GO:  Was the test performed?  No    Cognitive Function:        07/29/2023   11:48 AM 06/05/2022    2:32 PM  6CIT Screen  What Year? 0 points 0 points  What month? 0 points 0 points  What time? 0 points 0 points  Count back from 20 0 points 0 points  Months in reverse 0 points 0 points  Repeat phrase 2 points 0 points  Total Score 2 points 0 points    Immunizations  There is no immunization history on file for this patient.  TDAP status: Due, Education has been  provided regarding the importance of this vaccine. Advised may receive this vaccine at local pharmacy or Health Dept. Aware to provide a copy of the vaccination record if obtained from local pharmacy or Health Dept. Verbalized acceptance and understanding.  Flu Vaccine status: Declined, Education has been provided regarding the importance of this vaccine but patient still declined. Advised may receive this vaccine at local pharmacy or Health Dept. Aware to provide a copy of the vaccination record if  obtained from local pharmacy or Health Dept. Verbalized acceptance and understanding.  Pneumococcal vaccine status: Declined,  Education has been provided regarding the importance of this vaccine but patient still declined. Advised may receive this vaccine at local pharmacy or Health Dept. Aware to provide a copy of the vaccination record if obtained from local pharmacy or Health Dept. Verbalized acceptance and understanding.   Covid-19 vaccine status: Declined, Education has been provided regarding the importance of this vaccine but patient still declined. Advised may receive this vaccine at local pharmacy or Health Dept.or vaccine clinic. Aware to provide a copy of the vaccination record if obtained from local pharmacy or Health Dept. Verbalized acceptance and understanding.  Qualifies for Shingles Vaccine? Yes   Zostavax completed No   Shingrix Completed?: No.    Education has been provided regarding the importance of this vaccine. Patient has been advised to call insurance company to determine out of pocket expense if they have not yet received this vaccine. Advised may also receive vaccine at local pharmacy or Health Dept. Verbalized acceptance and understanding.  Screening Tests Health Maintenance  Topic Date Due   COVID-19 Vaccine (1) 08/14/2023 (Originally 11/16/1945)   Zoster Vaccines- Shingrix (1 of 2) 09/02/2023 (Originally 11/15/1959)   INFLUENZA VACCINE  01/03/2024 (Originally 05/06/2023)   DTaP/Tdap/Td (1 - Tdap) 07/28/2024 (Originally 11/15/1959)   Pneumonia Vaccine 69+ Years old (1 of 1 - PCV) 07/28/2024 (Originally 11/16/2003)   Medicare Annual Wellness (AWV)  07/28/2024   HPV VACCINES  Aged Out    Health Maintenance  There are no preventive care reminders to display for this patient.   Colorectal cancer screening: No longer required.   Lung Cancer Screening: (Low Dose CT Chest recommended if Age 29-80 years, 20 pack-year currently smoking OR have quit w/in 15years.) does not  qualify.   Lung Cancer Screening Referral: no  Additional Screening:  Hepatitis C Screening: does not qualify;   Vision Screening: Recommended annual ophthalmology exams for early detection of glaucoma and other disorders of the eye. Is the patient up to date with their annual eye exam?  Yes  Who is the provider or what is the name of the office in which the patient attends annual eye exams?  If pt is not established with a provider, would they like to be referred to a provider to establish care? No .   Dental Screening: Recommended annual dental exams for proper oral hygiene  Diabetic Foot Exam: n/a  Community Resource Referral / Chronic Care Management: CRR required this visit?  No   CCM required this visit?  No     Plan:     I have personally reviewed and noted the following in the patient's chart:   Medical and social history Use of alcohol, tobacco or illicit drugs  Current medications and supplements including opioid prescriptions. Patient is not currently taking opioid prescriptions. Functional ability and status Nutritional status Physical activity Advanced directives List of other physicians Hospitalizations, surgeries, and ER visits in previous 12 months Vitals Screenings to include cognitive, depression,  and falls Referrals and appointments  In addition, I have reviewed and discussed with patient certain preventive protocols, quality metrics, and best practice recommendations. A written personalized care plan for preventive services as well as general preventive health recommendations were provided to patient.     Barb Merino, LPN   [id number]   After Visit Summary: (Pick Up) Due to this being a telephonic visit, with patients personalized plan was offered to patient and patient has requested to Pick up at office.  Nurse Notes: none

## 2023-07-29 NOTE — Patient Instructions (Signed)
Aaron Bullock , Thank you for taking time to come for your Medicare Wellness Visit. I appreciate your ongoing commitment to your health goals. Please review the following plan we discussed and let me know if I can assist you in the future.   Referrals/Orders/Follow-Ups/Clinician Recommendations: none  This is a list of the screening recommended for you and due dates:  Health Maintenance  Topic Date Due   COVID-19 Vaccine (1) 08/14/2023*   Zoster (Shingles) Vaccine (1 of 2) 09/02/2023*   Flu Shot  01/03/2024*   DTaP/Tdap/Td vaccine (1 - Tdap) 07/28/2024*   Pneumonia Vaccine (1 of 1 - PCV) 07/28/2024*   Medicare Annual Wellness Visit  07/28/2024   HPV Vaccine  Aged Out  *Topic was postponed. The date shown is not the original due date.    Advanced directives: (In Chart) A copy of your advanced directives are scanned into your chart should your provider ever need it.  Next Medicare Annual Wellness Visit scheduled for next year: No, will schedule next year  insert Preventive Care attachment Insert FALL PREVENTION attachment if needed

## 2023-08-03 ENCOUNTER — Ambulatory Visit: Payer: Medicare Other | Attending: Ophthalmology | Admitting: Occupational Therapy

## 2023-08-03 ENCOUNTER — Encounter: Payer: Self-pay | Admitting: Occupational Therapy

## 2023-08-03 DIAGNOSIS — R41842 Visuospatial deficit: Secondary | ICD-10-CM | POA: Insufficient documentation

## 2023-08-03 DIAGNOSIS — H353212 Exudative age-related macular degeneration, right eye, with inactive choroidal neovascularization: Secondary | ICD-10-CM | POA: Diagnosis not present

## 2023-08-03 DIAGNOSIS — H401113 Primary open-angle glaucoma, right eye, severe stage: Secondary | ICD-10-CM | POA: Diagnosis not present

## 2023-08-03 NOTE — Therapy (Signed)
OUTPATIENT OCCUPATIONAL THERAPY LOW VISION EVALUATION  Patient Name: Aaron Bullock MRN: [id number] DOB:02/07/1941, 82 y.o., male Today's Date: 08/03/2023  PCP: Ronnald Nian, MD  REFERRING PROVIDER: Edmon Crape, MD  END OF SESSION:  OT End of Session - 08/03/23 1413     Visit Number 1    Authorization Type Medicare    OT Start Time 1406    OT Stop Time 1448    OT Time Calculation (min) 42 min    Activity Tolerance Patient tolerated treatment well    Behavior During Therapy WFL for tasks assessed/performed             Past Medical History:  Diagnosis Date   Bladder mass    Cancer (HCC)    skin, basal   Coronary artery disease    Glaucoma    both eyes   Left inguinal hernia    Macular degeneration    both eyes   Pneumonia    as child   Sleep apnea    mild uses oral appliance   Past Surgical History:  Procedure Laterality Date   cagb x 4  11/1994   CATARACT EXTRACTION Bilateral 2013   Dr. Vonna Kotyk   CYSTOSCOPY N/A 05/14/2023   Procedure: Bluford Kaufmann;  Surgeon: Rene Paci, MD;  Location: [facility];  Service: Urology;  Laterality: N/A;  45 MINUTES   HEMORRHOID SURGERY  32   TONSILLECTOMY     age 57   Patient Active Problem List   Diagnosis Date Noted   Bladder cancer (HCC) 05/26/2023   Hypertensive retinopathy 05/26/2023   Urothelial carcinoma (HCC) 05/17/2023   Exudative retinopathy 01/20/2023   History of cataract extraction 04/28/2021   Adult onset vitelliform macular dystrophy 02/25/2021   Exudative age-related macular degeneration of right eye with inactive choroidal neovascularization (HCC) 05/23/2020   Sarcoid uveitis of right eye 05/23/2020   Serous detachment of retinal pigment epithelium of right eye 01/24/2020   Serous detachment of retinal pigment epithelium of left eye 01/24/2020   OSA (obstructive sleep apnea) 12/07/2019   Primary open angle glaucoma of right eye, severe stage 06/28/2019   Immunization refused 10/14/2018    S/P CABG x 3 10/14/2018   ASHD (arteriosclerotic heart disease) 10/14/2018    ONSET DATE: 06/28/2023  REFERRING DIAG: H40.9 (ICD-10-CM) - Glaucoma  THERAPY DIAG:  Visuospatial deficit  Exudative age-related macular degeneration of right eye with inactive choroidal neovascularization (HCC)  Primary open angle glaucoma of right eye, severe stage  Rationale for Evaluation and Treatment: Rehabilitation  SUBJECTIVE:   SUBJECTIVE STATEMENT: Pt states he thinks he has figured a lot of things out with respect to his vision and compensation. He has been seen by 4 ophthalmologists in the last few months. He feels his vision declines almost daily and seems like he is looking through a cloth. He describes it as blurred.    Pt accompanied by: self; significant other in lobby  PERTINENT HISTORY: Pt has had cataract extraction, MD, and glaucoma B but R>L  PAIN:  Are you having pain? No  FALLS: Has patient fallen in last 6 months? 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  PATIENT GOALS: to see better  OBJECTIVE:   GLASSES: has readers but with little improvement to near acuity  CURRENT MODIFICATIONS/ADAPTIONS:  uses larger font on iPad and computer; voiceover for computer  DEVICES: iPad, magnifier  PHONE: flip with large buttons  HAND DOMINANCE: Right  ADLs: Overall ADLs: mod I  MOBILITY STATUS:  hand over hand assistance or cueing  FUNCTIONAL OUTCOME MEASURES: N/A  COGNITION: Overall cognitive status: Within functional limits for tasks assessed  VISUAL FINDINGS: Baseline vision:  generally 20/100 OU; impaired peripheral vision OD>OS Visual history: glaucoma, cataracts, macular degeneration, and corrective eye surgery  Impaired  OBSERVATIONS:  Pt appears well-kept. Ambulates without AD though hesitant with navigation requiring cueing. No LOB.   TODAY'S TREATMENT:                                                                                                                               OT discussed role of OT for low vision. Pt identified locating and using mouse cursor on computer as a limitation. OT educated, demonstrated, and wrote instructions for use on home computer.   OT educated pt on CCTV and how it can be used to read items such as papers, mail, labels, pill bottles, etc. Information provided on how to trial use of CCTV locally.   PATIENT EDUCATION: Education details: OT Role and POC Person educated: Patient Education method: Explanation  Education comprehension: verbalized understanding   HOME EXERCISE PROGRAM: N/A  GOALS:  N/A  ASSESSMENT:  CLINICAL IMPRESSION: Patient is a 82 y.o. y.o. male who was seen today for occupational therapy evaluation for assessment of vision related impairments to ADLs and IADLs. Patient needs addressed at eval. No further need for skilled OT services at this time. Would recommend pt go to [employer] in [place] to trial CCTV for home use.   PERFORMANCE DEFICITS: in functional skills including vision.   IMPAIRMENTS: are limiting patient from ADLs, IADLs, education, leisure, and social participation.   CO-MORBIDITIES: may have co-morbidities  that affects occupational performance. Patient will benefit from skilled OT to address above impairments and improve overall function.  MODIFICATION OR ASSISTANCE TO COMPLETE EVALUATION: Min-Moderate modification of tasks or assist with assess necessary to complete an evaluation.  OT OCCUPATIONAL PROFILE AND HISTORY: Problem focused assessment: Including review of records relating to presenting problem.  CLINICAL DECISION MAKING: LOW - limited treatment options, no task modification necessary  REHAB POTENTIAL:  Good  EVALUATION COMPLEXITY: Low    PLAN:  OT FREQUENCY/DURATION: eval only  RECOMMENDED OTHER SERVICES: Would recommend pt go to [employer] in [place] to trial CCTV for home use.   CONSULTED AND AGREED WITH PLAN OF CARE: Patient    Delana Meyer, OT 08/03/2023, 3:12 PM

## 2023-08-05 DIAGNOSIS — C672 Malignant neoplasm of lateral wall of bladder: Secondary | ICD-10-CM | POA: Diagnosis not present

## 2023-08-09 ENCOUNTER — Encounter: Payer: Self-pay | Admitting: Family Medicine

## 2023-09-13 DIAGNOSIS — H353222 Exudative age-related macular degeneration, left eye, with inactive choroidal neovascularization: Secondary | ICD-10-CM | POA: Diagnosis not present

## 2023-09-13 DIAGNOSIS — H401113 Primary open-angle glaucoma, right eye, severe stage: Secondary | ICD-10-CM | POA: Diagnosis not present

## 2023-09-13 DIAGNOSIS — H02224 Mechanical lagophthalmos left upper eyelid: Secondary | ICD-10-CM | POA: Diagnosis not present

## 2023-09-13 DIAGNOSIS — H353134 Nonexudative age-related macular degeneration, bilateral, advanced atrophic with subfoveal involvement: Secondary | ICD-10-CM | POA: Diagnosis not present

## 2023-09-13 DIAGNOSIS — H18412 Arcus senilis, left eye: Secondary | ICD-10-CM | POA: Diagnosis not present

## 2023-09-13 DIAGNOSIS — H16142 Punctate keratitis, left eye: Secondary | ICD-10-CM | POA: Diagnosis not present

## 2023-09-13 DIAGNOSIS — H2012 Chronic iridocyclitis, left eye: Secondary | ICD-10-CM | POA: Diagnosis not present

## 2023-09-13 DIAGNOSIS — H353212 Exudative age-related macular degeneration, right eye, with inactive choroidal neovascularization: Secondary | ICD-10-CM | POA: Diagnosis not present

## 2023-09-24 DIAGNOSIS — H353122 Nonexudative age-related macular degeneration, left eye, intermediate dry stage: Secondary | ICD-10-CM | POA: Diagnosis not present

## 2023-09-24 DIAGNOSIS — H3581 Retinal edema: Secondary | ICD-10-CM | POA: Diagnosis not present

## 2023-09-24 DIAGNOSIS — Z961 Presence of intraocular lens: Secondary | ICD-10-CM | POA: Diagnosis not present

## 2023-09-24 DIAGNOSIS — H353211 Exudative age-related macular degeneration, right eye, with active choroidal neovascularization: Secondary | ICD-10-CM | POA: Diagnosis not present

## 2023-09-24 DIAGNOSIS — Z79899 Other long term (current) drug therapy: Secondary | ICD-10-CM | POA: Diagnosis not present

## 2023-09-24 DIAGNOSIS — H30033 Focal chorioretinal inflammation, peripheral, bilateral: Secondary | ICD-10-CM | POA: Diagnosis not present

## 2023-10-28 DIAGNOSIS — H401133 Primary open-angle glaucoma, bilateral, severe stage: Secondary | ICD-10-CM | POA: Diagnosis not present

## 2023-11-01 DIAGNOSIS — R8289 Other abnormal findings on cytological and histological examination of urine: Secondary | ICD-10-CM | POA: Diagnosis not present

## 2023-11-01 DIAGNOSIS — Z8551 Personal history of malignant neoplasm of bladder: Secondary | ICD-10-CM | POA: Diagnosis not present

## 2023-11-23 DIAGNOSIS — H3581 Retinal edema: Secondary | ICD-10-CM | POA: Diagnosis not present

## 2023-11-23 DIAGNOSIS — Z961 Presence of intraocular lens: Secondary | ICD-10-CM | POA: Diagnosis not present

## 2023-11-23 DIAGNOSIS — H353122 Nonexudative age-related macular degeneration, left eye, intermediate dry stage: Secondary | ICD-10-CM | POA: Diagnosis not present

## 2023-11-23 DIAGNOSIS — H353211 Exudative age-related macular degeneration, right eye, with active choroidal neovascularization: Secondary | ICD-10-CM | POA: Diagnosis not present

## 2023-11-23 DIAGNOSIS — Z79899 Other long term (current) drug therapy: Secondary | ICD-10-CM | POA: Diagnosis not present

## 2023-11-23 DIAGNOSIS — H30033 Focal chorioretinal inflammation, peripheral, bilateral: Secondary | ICD-10-CM | POA: Diagnosis not present

## 2023-11-24 DIAGNOSIS — D225 Melanocytic nevi of trunk: Secondary | ICD-10-CM | POA: Diagnosis not present

## 2023-11-24 DIAGNOSIS — L570 Actinic keratosis: Secondary | ICD-10-CM | POA: Diagnosis not present

## 2023-11-24 DIAGNOSIS — L814 Other melanin hyperpigmentation: Secondary | ICD-10-CM | POA: Diagnosis not present

## 2023-11-24 DIAGNOSIS — Z85828 Personal history of other malignant neoplasm of skin: Secondary | ICD-10-CM | POA: Diagnosis not present

## 2023-11-24 DIAGNOSIS — L821 Other seborrheic keratosis: Secondary | ICD-10-CM | POA: Diagnosis not present

## 2023-11-24 DIAGNOSIS — L578 Other skin changes due to chronic exposure to nonionizing radiation: Secondary | ICD-10-CM | POA: Diagnosis not present

## 2023-11-30 ENCOUNTER — Encounter: Payer: Self-pay | Admitting: Internal Medicine

## 2023-12-02 DIAGNOSIS — H401133 Primary open-angle glaucoma, bilateral, severe stage: Secondary | ICD-10-CM | POA: Diagnosis not present

## 2024-02-03 DIAGNOSIS — Z8551 Personal history of malignant neoplasm of bladder: Secondary | ICD-10-CM | POA: Diagnosis not present

## 2024-02-15 DIAGNOSIS — H30033 Focal chorioretinal inflammation, peripheral, bilateral: Secondary | ICD-10-CM | POA: Diagnosis not present

## 2024-02-15 DIAGNOSIS — Z961 Presence of intraocular lens: Secondary | ICD-10-CM | POA: Diagnosis not present

## 2024-02-15 DIAGNOSIS — H353122 Nonexudative age-related macular degeneration, left eye, intermediate dry stage: Secondary | ICD-10-CM | POA: Diagnosis not present

## 2024-02-15 DIAGNOSIS — H3581 Retinal edema: Secondary | ICD-10-CM | POA: Diagnosis not present

## 2024-02-15 DIAGNOSIS — Z79899 Other long term (current) drug therapy: Secondary | ICD-10-CM | POA: Diagnosis not present

## 2024-02-15 DIAGNOSIS — H353211 Exudative age-related macular degeneration, right eye, with active choroidal neovascularization: Secondary | ICD-10-CM | POA: Diagnosis not present

## 2024-04-27 DIAGNOSIS — H401133 Primary open-angle glaucoma, bilateral, severe stage: Secondary | ICD-10-CM | POA: Diagnosis not present

## 2024-05-05 NOTE — Procedures (Signed)
Result scanned to media

## 2024-05-08 DIAGNOSIS — Z8551 Personal history of malignant neoplasm of bladder: Secondary | ICD-10-CM | POA: Diagnosis not present

## 2024-06-28 DIAGNOSIS — H401133 Primary open-angle glaucoma, bilateral, severe stage: Secondary | ICD-10-CM | POA: Diagnosis not present

## 2024-07-26 DIAGNOSIS — Z79899 Other long term (current) drug therapy: Secondary | ICD-10-CM | POA: Diagnosis not present

## 2024-07-26 DIAGNOSIS — H30033 Focal chorioretinal inflammation, peripheral, bilateral: Secondary | ICD-10-CM | POA: Diagnosis not present

## 2024-07-31 DIAGNOSIS — Z8551 Personal history of malignant neoplasm of bladder: Secondary | ICD-10-CM | POA: Diagnosis not present

## 2024-08-07 DIAGNOSIS — Z8551 Personal history of malignant neoplasm of bladder: Secondary | ICD-10-CM | POA: Diagnosis not present

## 2024-08-08 DIAGNOSIS — H401133 Primary open-angle glaucoma, bilateral, severe stage: Secondary | ICD-10-CM | POA: Diagnosis not present

## 2024-08-08 DIAGNOSIS — H30033 Focal chorioretinal inflammation, peripheral, bilateral: Secondary | ICD-10-CM | POA: Diagnosis not present

## 2024-08-08 DIAGNOSIS — Z79899 Other long term (current) drug therapy: Secondary | ICD-10-CM | POA: Diagnosis not present

## 2024-08-08 DIAGNOSIS — H3581 Retinal edema: Secondary | ICD-10-CM | POA: Diagnosis not present

## 2024-08-08 DIAGNOSIS — H353211 Exudative age-related macular degeneration, right eye, with active choroidal neovascularization: Secondary | ICD-10-CM | POA: Diagnosis not present

## 2024-08-08 DIAGNOSIS — Z961 Presence of intraocular lens: Secondary | ICD-10-CM | POA: Diagnosis not present

## 2024-08-08 DIAGNOSIS — H353122 Nonexudative age-related macular degeneration, left eye, intermediate dry stage: Secondary | ICD-10-CM | POA: Diagnosis not present

## 2024-08-21 DIAGNOSIS — Z8551 Personal history of malignant neoplasm of bladder: Secondary | ICD-10-CM | POA: Diagnosis not present

## 2024-09-18 ENCOUNTER — Encounter: Payer: Self-pay | Admitting: Family Medicine

## 2024-09-18 ENCOUNTER — Ambulatory Visit: Admitting: Family Medicine

## 2024-09-18 VITALS — BP 134/72 | HR 51 | Ht 64.0 in | Wt 156.8 lb

## 2024-09-18 DIAGNOSIS — Z951 Presence of aortocoronary bypass graft: Secondary | ICD-10-CM

## 2024-09-18 DIAGNOSIS — Z0000 Encounter for general adult medical examination without abnormal findings: Secondary | ICD-10-CM

## 2024-09-18 DIAGNOSIS — H353190 Nonexudative age-related macular degeneration, unspecified eye, stage unspecified: Secondary | ICD-10-CM

## 2024-09-18 DIAGNOSIS — Z136 Encounter for screening for cardiovascular disorders: Secondary | ICD-10-CM

## 2024-09-18 NOTE — Progress Notes (Signed)
 Name: Aaron Bullock   Date of Visit: 09/18/2024   Date of last visit with me: Visit date not found   CHIEF COMPLAINT:  Chief Complaint  Patient presents with   Medicare Wellness    Medicare annual wellness visit.        HPI:  Discussed the use of AI scribe software for clinical note transcription with the patient, who gave verbal consent to proceed.  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  He is legally blind due to glaucoma and macular degeneration. He sees one ophthalmologist for glaucoma and another for macular degeneration. He mentions losing a few more cells every day. He does not drive and relies on his wife for transportation. He uses a computer with Windows 10 to have text read aloud and watches television on a 55-inch screen from four feet away to follow ball games.  In the summer of 2023, he experienced severe abdominal pain and was found to have a tumor on his bladder following a CT scan. He was referred to urology, where a procedure was performed to remove the tumor, followed by a series of bladder treatments with a chemotherapy agent every three months. A recent CT scan did not show any new findings, and the patient reports that regular scoping has not revealed any issues according to his urologist. No significant changes in his urine stream and he can urinate comfortably.  He had quadruple bypass surgery 30 years ago and has been stable since. He walks a mile by pacing his driveway and can walk without stress. He was cleared by a cardiologist for bladder surgery last year.  He engages in some physical activity, including walking up and down stairs multiple times a day and occasionally doing pushups and knee bends. He is cautious on stairs after a fall two and a half years ago, which resulted in a jammed finger but no other injuries. During the COVID  pandemic, he supplemented with vitamin C, zinc, and quercetin, which he believes helped him remain symptom-free. His wife also benefited from these supplements when feeling unwell.         OBJECTIVE:       09/18/2024    2:57 PM  Depression screen PHQ 2/9  Decreased Interest 0  Down, Depressed, Hopeless 0  PHQ - 2 Score 0     BP Readings from Last 3 Encounters:  09/18/24 134/72  05/14/23 (!) 148/84  05/04/23 (!) 157/70    BP 134/72   Pulse (!) 51   Ht 5' 4 (1.626 m)   Wt 156 lb 12.8 oz (71.1 kg)   SpO2 98%   BMI 26.91 kg/m    Physical Exam          Physical Exam Constitutional:      Appearance: Normal appearance.  Neurological:     General: No focal deficit present.     Mental Status: He is alert and oriented to person, place, and time. Mental status is at baseline.      ASSESSMENT/PLAN:   Assessment & Plan Encounter for Medicare annual wellness exam  Nonexudative age-related macular degeneration, unspecified laterality, unspecified stage  Encounter for screening for cardiovascular disorders  S/P CABG x 3    Assessment and Plan    Legal blindness due to glaucoma and macular degeneration Legally blind due to glaucoma and macular degeneration. Uses assistive technology for  daily activities and does not drive due to visual limitations. - Continue current management with ophthalmologists for glaucoma and macular degeneration.  History of bladder cancer, post resection and intravesical chemotherapy Bladder cancer treated with resection and intravesical chemotherapy. Recent CT scan and cystoscopy show no recurrence. - Continue regular follow-up with urology for surveillance. - CBC and CMP pending   General health maintenance Engages in regular physical activity. Discussed potential benefits of supplements for overall health and muscle function. - Continue regular physical activity. - Consider supplements: vitamin C, vitamin D , magnesium glycinate, and  creatine monohydrate.         Willella Harding A. Vita MD [facility]

## 2024-09-18 NOTE — Progress Notes (Signed)
 Chief Complaint  Patient presents with   Medicare Wellness    Medicare annual wellness visit.      Subjective:   Aaron Bullock is a 83 y.o. male who presents for a Medicare Annual Wellness Visit.  Visit info / Clinical Intake: Medicare Wellness Visit Type:: Subsequent Annual Wellness Visit Persons participating in visit and providing information:: patient Medicare Wellness Visit Mode:: In-person (required for WTM) Interpreter Needed?: No Pre-visit prep was completed: no AWV questionnaire completed by patient prior to visit?: no Living arrangements:: lives with spouse/significant other Patient's Overall Health Status Rating: good Typical amount of pain: none Does pain affect daily life?: no Are you currently prescribed opioids?: no  Dietary Habits and Nutritional Risks How many meals a day?: (!) 1 Eats fruit and vegetables daily?: yes Most meals are obtained by: preparing own meals; eating out In the last 2 weeks, have you had any of the following?: none Diabetic:: no  Functional Status Activities of Daily Living (to include ambulation/medication): Independent Ambulation: Independent Medication Administration: Independent Manage your own finances?: yes Primary transportation is: family / friends Concerns about vision?: no *vision screening is required for WTM* Concerns about hearing?: no  Fall Screening Falls in the past year?: 0 Number of falls in past year: 0 Was there an injury with Fall?: 0 Fall Risk Category Calculator: 0 Patient Fall Risk Level: Low Fall Risk  Fall Risk Patient at Risk for Falls Due to: No Fall Risks Fall risk Follow up: Falls evaluation completed  Home and Transportation Safety: All rugs have non-skid backing?: yes All stairs or steps have railings?: yes Grab bars in the bathtub or shower?: yes Have non-skid surface in bathtub or shower?: yes Good home lighting?: yes Regular seat belt use?: yes Hospital stays in the last year::  no  Cognitive Assessment Difficulty concentrating, remembering, or making decisions? : no Will 6CIT or Mini Cog be Completed: yes What year is it?: 0 points What month is it?: 0 points Give patient an address phrase to remember (5 components): [address] About what time is it?: 0 points Count backwards from 20 to 1: 0 points Say the months of the year in reverse: 0 points Repeat the address phrase from earlier: 0 points 6 CIT Score: 0 points  Advance Directives (For Healthcare) Does Patient Have a Medical Advance Directive?: Yes Does patient want to make changes to medical advance directive?: Yes (Inpatient - patient defers changing a medical advance directive at this time - Information given) Type of Advance Directive: Healthcare Power of Ep[place]; Living will; Out of facility DNR (pink MOST or yellow form) Copy of Healthcare Power of Attorney in Chart?: Yes - validated most recent copy scanned in chart (See row information) Copy of Living Will in Chart?: Yes - validated most recent copy scanned in chart (See row information) Out of facility DNR (pink MOST or yellow form) in Chart? (Ambulatory ONLY): Yes - validated most recent copy scanned in chart  Reviewed/Updated  Reviewed/Updated: Reviewed All (Medical, Surgical, Family, Medications, Allergies, Care Teams, Patient Goals)    Allergies (verified) Penicillins and Sulfacetamide sodium   Current Medications (verified) Outpatient Encounter Medications as of 09/18/2024  Medication Sig   brimonidine-timolol (COMBIGAN) 0.2-0.5 % ophthalmic solution Place 1 drop into both eyes in the morning and at bedtime.   latanoprost (XALATAN) 0.005 % ophthalmic solution Place 1 drop into both eyes at bedtime.   Multiple Vitamins-Minerals (VITAMINS/MINERALS PO) Take 1 tablet by mouth daily.   Latanoprostene Bunod  (VYZULTA ) 0.024 %  SOLN One drop to right eye in morning (Patient not taking: Reported on 09/18/2024)   oxybutynin  (DITROPAN ) 5 MG  tablet Take 1 tablet (5 mg total) by mouth every 8 (eight) hours as needed for bladder spasms. (Patient not taking: Reported on 09/18/2024)   No facility-administered encounter medications on file as of 09/18/2024.    History: Past Medical History:  Diagnosis Date   Bladder mass    Cancer (HCC)    skin, basal   Coronary artery disease    Glaucoma    both eyes   Left inguinal hernia    Macular degeneration    both eyes   Pneumonia    as child   Sleep apnea    mild uses oral appliance   Past Surgical History:  Procedure Laterality Date   cagb x 4  11/1994   CATARACT EXTRACTION Bilateral 2013   Dr. Lavonia   CYSTOSCOPY N/A 05/14/2023   Procedure: CYSTOSCOPY;  Surgeon: Devere Lonni Righter, MD;  Location: [facility];  Service: Urology;  Laterality: N/A;  45 MINUTES   HEMORRHOID SURGERY  1995   TONSILLECTOMY     age 83   Family History  Problem Relation Age of Onset   Arrhythmia Mother    Heart attack Father 87   Heart disease Father    Breast cancer Sister    Heart disease Brother        CABG   Amblyopia Neg Hx    Blindness Neg Hx    Cancer Neg Hx    Cataracts Neg Hx    Diabetes Neg Hx    Glaucoma Neg Hx    Hypertension Neg Hx    Macular degeneration Neg Hx    Retinal detachment Neg Hx    Strabismus Neg Hx    Stroke Neg Hx    Thyroid disease Neg Hx    Retinitis pigmentosa Neg Hx    Social History   Occupational History   Not on file  Tobacco Use   Smoking status: Never   Smokeless tobacco: Never  Vaping Use   Vaping status: Never Used  Substance and Sexual Activity   Alcohol use: Yes    Alcohol/week: 7.0 standard drinks of alcohol    Types: 7 Glasses of wine per week    Comment: 1 glass wine per day   Drug use: Never   Sexual activity: Not Currently   Tobacco Counseling Counseling given: Not Answered  SDOH Screenings   Food Insecurity: No Food Insecurity (09/18/2024)  Housing: Low Risk (09/18/2024)  Transportation Needs: No Transportation Needs  (09/18/2024)  Utilities: Not At Risk (09/18/2024)  Alcohol Screen: Low Risk (07/29/2023)  Depression (PHQ2-9): Low Risk (09/18/2024)  Financial Resource Strain: Low Risk (07/29/2023)  Physical Activity: Insufficiently Active (09/18/2024)  Social Connections: Moderately Isolated (09/18/2024)  Stress: No Stress Concern Present (09/18/2024)  Tobacco Use: Low Risk (09/18/2024)  Health Literacy: Inadequate Health Literacy (09/18/2024)   See flowsheets for full screening details  Depression Screen PHQ 2 & 9 Depression Scale- Over the past 2 weeks, how often have you been bothered by any of the following problems? Little interest or pleasure in doing things: 0 Feeling down, depressed, or hopeless (PHQ Adolescent also includes...irritable): 0 PHQ-2 Total Score: 0     Goals Addressed             This Visit's Progress    Patient Stated       Denies goals  Objective:    Today's Vitals   09/18/24 1502  BP: 134/72  Pulse: (!) 51  SpO2: 98%  Weight: 156 lb 12.8 oz (71.1 kg)  Height: 5' 4 (1.626 m)   Body mass index is 26.91 kg/m.  Hearing/Vision screen No results found. Immunizations and Health Maintenance Health Maintenance  Topic Date Due   DTaP/Tdap/Td (1 - Tdap) 09/26/2024 (Originally 11/15/1959)   COVID-19 Vaccine (1) 10/04/2024 (Originally 05/16/1941)   Zoster Vaccines- Shingrix (1 of 2) 12/17/2024 (Originally 11/15/1959)   Influenza Vaccine  01/02/2025 (Originally 05/05/2024)   Pneumococcal Vaccine: 50+ Years (1 of 2 - PCV) 09/18/2025 (Originally 11/15/1959)   Medicare Annual Wellness (AWV)  09/18/2025   Meningococcal B Vaccine  Aged Out        Assessment/Plan:  This is a routine wellness examination for Aaron Bullock.  Patient Care Team: Joyce Norleen BROCKS, MD as PCP - General (Family Medicine)  I have personally reviewed and noted the following in the patients chart:   Medical and social history Use of alcohol, tobacco or illicit drugs  Current  medications and supplements including opioid prescriptions. Functional ability and status Nutritional status Physical activity Advanced directives List of other physicians Hospitalizations, surgeries, and ER visits in previous 12 months Vitals Screenings to include cognitive, depression, and falls Referrals and appointments  >=15 minutes spent in face-to-face counseling focused on reducing cardiovascular risk. Topics reviewed: - Assessment of individual CVD risk factors (BP, lipids, glucose, weight, activity level) - Lifestyle modifications including heart-healthy diet, sodium reduction, and increased physical activity - Smoking cessation and alcohol moderation when applicable - Importance of blood pressure control, lipid optimization, and medication adherence - Personalized risk-reduction plan discussed and agreed upon   Orders Placed This Encounter  Procedures   CBC with Differential   Comprehensive metabolic panel   In addition, I have reviewed and discussed with patient certain preventive protocols, quality metrics, and best practice recommendations. A written personalized care plan for preventive services as well as general preventive health recommendations were provided to patient.   Reyne DELENA Bustle, MD   09/18/2024   No follow-ups on file.  After Visit Summary: (In Person-Printed) AVS printed and given to the patient  Nurse Notes: none

## 2024-09-18 NOTE — Patient Instructions (Addendum)
 Magnesium Glycinate   Vitamin D   Creatine Monohydrate

## 2024-09-19 ENCOUNTER — Ambulatory Visit: Payer: Self-pay | Admitting: Family Medicine

## 2024-09-19 LAB — COMPREHENSIVE METABOLIC PANEL WITH GFR
ALT: 19 IU/L (ref 0–44)
AST: 22 IU/L (ref 0–40)
Albumin: 3.9 g/dL (ref 3.7–4.7)
Alkaline Phosphatase: 60 IU/L (ref 48–129)
BUN/Creatinine Ratio: 15 (ref 10–24)
BUN: 13 mg/dL (ref 8–27)
Bilirubin Total: 0.3 mg/dL (ref 0.0–1.2)
CO2: 24 mmol/L (ref 20–29)
Calcium: 9.4 mg/dL (ref 8.6–10.2)
Chloride: 102 mmol/L (ref 96–106)
Creatinine, Ser: 0.88 mg/dL (ref 0.76–1.27)
Globulin, Total: 2.2 g/dL (ref 1.5–4.5)
Glucose: 88 mg/dL (ref 70–99)
Potassium: 4.4 mmol/L (ref 3.5–5.2)
Sodium: 140 mmol/L (ref 134–144)
Total Protein: 6.1 g/dL (ref 6.0–8.5)
eGFR: 84 mL/min/1.73 (ref >59)

## 2024-09-19 LAB — CBC WITH DIFFERENTIAL/PLATELET
Basophils Absolute: 0 x10E3/uL (ref 0.0–0.2)
Basos: 1 %
EOS (ABSOLUTE): 0.1 x10E3/uL (ref 0.0–0.4)
Eos: 1 %
Hematocrit: 45.2 % (ref 37.5–51.0)
Hemoglobin: 14.8 g/dL (ref 13.0–17.7)
Immature Grans (Abs): 0 x10E3/uL (ref 0.0–0.1)
Immature Granulocytes: 0 %
Lymphocytes Absolute: 1.6 x10E3/uL (ref 0.7–3.1)
Lymphs: 27 %
MCH: 31.3 pg (ref 26.6–33.0)
MCHC: 32.7 g/dL (ref 31.5–35.7)
MCV: 96 fL (ref 79–97)
Monocytes Absolute: 0.4 x10E3/uL (ref 0.1–0.9)
Monocytes: 7 %
Neutrophils Absolute: 4 x10E3/uL (ref 1.4–7.0)
Neutrophils: 64 %
Platelets: 189 x10E3/uL (ref 150–450)
RBC: 4.73 x10E6/uL (ref 4.14–5.80)
RDW: 13.4 % (ref 11.6–15.4)
WBC: 6.1 x10E3/uL (ref 3.4–10.8)
